# Patient Record
Sex: Female | Born: 1990 | Race: Black or African American | Hispanic: No | Marital: Single | Smoking: Never smoker
Health system: Southern US, Community
[De-identification: ages and names within clinical notes are randomized; demographics above are authoritative.]

## PROBLEM LIST (undated history)

## (undated) DIAGNOSIS — E669 Obesity, unspecified: Secondary | ICD-10-CM

## (undated) HISTORY — PX: CHOLECYSTECTOMY: SHX55

## (undated) HISTORY — DX: Obesity, unspecified: E66.9

## (undated) HISTORY — PX: DILATION AND CURETTAGE OF UTERUS: SHX78

---
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
## 2010-09-21 ENCOUNTER — Emergency Department: Payer: Self-pay | Admitting: Emergency Medicine

## 2014-02-15 ENCOUNTER — Emergency Department: Payer: Self-pay | Admitting: Emergency Medicine

## 2014-02-15 LAB — URINALYSIS, COMPLETE
Bacteria: NONE SEEN
Bilirubin,UR: NEGATIVE
Glucose,UR: NEGATIVE mg/dL (ref 0–75)
Leukocyte Esterase: NEGATIVE
Nitrite: NEGATIVE
Ph: 5 (ref 4.5–8.0)
Protein: NEGATIVE
RBC,UR: 7 /HPF (ref 0–5)
Specific Gravity: 1.028 (ref 1.003–1.030)
Squamous Epithelial: 4
WBC UR: NONE SEEN /HPF (ref 0–5)

## 2015-01-16 ENCOUNTER — Emergency Department
Admit: 2015-01-16 | Disposition: A | Discharge status: Home / Self Care | Payer: Self-pay | Admitting: Emergency Medicine

## 2015-01-20 ENCOUNTER — Emergency Department
Admit: 2015-01-20 | Disposition: A | Discharge status: Home / Self Care | Payer: Self-pay | Admitting: Emergency Medicine

## 2015-01-28 ENCOUNTER — Ambulatory Visit
Admit: 2015-01-28 | Disposition: A | Discharge status: Home / Self Care | Payer: Self-pay | Attending: Surgery | Admitting: Surgery

## 2015-01-28 LAB — HEPATIC FUNCTION PANEL A (ARMC)
Albumin: 4 g/dL
Alkaline Phosphatase: 59 U/L
Bilirubin, Direct: 0.1 mg/dL
Bilirubin,Total: 0.8 mg/dL
Indirect Bilirubin: 0.7
SGOT(AST): 12 U/L — ABNORMAL LOW
SGPT (ALT): 10 U/L — ABNORMAL LOW
Total Protein: 7.3 g/dL

## 2015-01-28 LAB — CBC WITH DIFFERENTIAL/PLATELET
Basophil #: 0 10*3/uL (ref 0.0–0.1)
Basophil %: 0.9 %
Eosinophil #: 0.1 10*3/uL (ref 0.0–0.7)
Eosinophil %: 2 %
HCT: 35.6 % (ref 35.0–47.0)
HGB: 11.9 g/dL — ABNORMAL LOW (ref 12.0–16.0)
Lymphocyte #: 1.6 10*3/uL (ref 1.0–3.6)
Lymphocyte %: 41.7 %
MCH: 30.4 pg (ref 26.0–34.0)
MCHC: 33.4 g/dL (ref 32.0–36.0)
MCV: 91 fL (ref 80–100)
Monocyte #: 0.5 x10 3/mm (ref 0.2–0.9)
Monocyte %: 13.2 %
Neutrophil #: 1.6 10*3/uL (ref 1.4–6.5)
Neutrophil %: 42.2 %
Platelet: 341 10*3/uL (ref 150–440)
RBC: 3.9 10*6/uL (ref 3.80–5.20)
RDW: 14.3 % (ref 11.5–14.5)
WBC: 3.8 10*3/uL (ref 3.6–11.0)

## 2015-01-30 ENCOUNTER — Ambulatory Visit
Admit: 2015-01-30 | Disposition: A | Discharge status: Home / Self Care | Payer: Self-pay | Attending: Surgery | Admitting: Surgery

## 2015-02-04 LAB — SURGICAL PATHOLOGY

## 2015-02-10 NOTE — Op Note (Signed)
PATIENT NAME:  Carrie Hanson, Carrie Hanson MR#:  [id number] DATE OF BIRTH:  24-Nov-1990  DATE OF PROCEDURE:  01/30/2015  PREOPERATIVE DIAGNOSIS: Biliary colic, cholelithiasis.  POSTOPERATIVE DIAGNOSIS: Biliary colic, cholelithiasis.   PROCEDURE PERFORMED: Laparoscopic cholecystectomy.   SURGEON: Drinda Belgard A. Egbert GaribaldiBird, MD   ASSISTANT: Scrub tech.   TYPE OF ANESTHESIA: General.   FINDINGS: Two large stones.   SPECIMENS: Gallbladder with contents.   ESTIMATED BLOOD LOSS: Minimal.   DESCRIPTION OF PROCEDURE: With informed consent, supine position, general endotracheal anesthesia, the patient'Hanson abdomen was widely prepped with ChloraPrep solution. Timeout was observed. Through an infraumbilical transversely oriented skin incision, a 12 mm blunt Hasson trocar was placed under direct visualization. Stay sutures being passed. Pneumoperitoneum was established to a total of 18 mmHg pressure. Laparoscopic evaluation of the abdomen demonstrated normal-appearing liver. The patient was positioned in steep reverse Trendelenburg and airplane right side up. Remaining trocars were placed with 5 mm in the epigastric, two 5 mm in the subcostal margin. The gallbladder was grasped along its fundus and elevated towards the right shoulder. Lateral traction was achieved on Hartman pouch. Chronic-appearing adhesions were taken down with sharp dissection and point cautery. A critical view of safety cholecystectomy was achieved. The cystic duct was doubly clipped on the portal side, singly clipped on the gallbladder side and divided. A single cystic artery was divided between similarly placed hemoclips. Further dissection demonstrated no evidence of an aberrant artery or bile duct.  One small lymphatic channel was divided between single hemoclips. The gallbladder was then retrieved off the gallbladder fossa utilizing hook cautery apparatus, hemostasis being adequate, placed into an Endo Catch device and retrieved. During the extraction  process, a 5 mm surgical telescope was introduced in the epigastric region demonstrating no evidence of injury to bowel from the trocar insertion at the umbilicus. With pneumoperitoneum then re-established, the right upper quadrant was irrigated with approximately 0.5 liters of warm normal saline and aspirated dry and hemostasis appeared to be excellent on the operative field.   Ports were then removed. The infraumbilical fascial defect was reapproximated with multiple simple and figure-of-eight #0 Vicryl sutures in vertical orientation and the existing stay sutures tied to each other. A total of 30 mL of 0.25% plain Marcaine was infiltrated along all skin and fascial incisions prior to closure. Vicryl 4-0 subcuticular was applied to all skin edges followed by benzoin, Steri-Strips, Telfa, and Tegaderm. The patient was then taken to the recovery room in stable and satisfactory condition by anesthesia services extubated.    ____________________________ Redge GainerMark A. Egbert GaribaldiBird, MD mab:at D: 01/30/2015 10:32:35 ET T: 01/30/2015 13:52:16 ET JOB#: [id number]  cc: Loraine LericheMark A. Egbert GaribaldiBird, MD, <Dictator> Raynald KempMARK A Sabian Kuba MD ELECTRONICALLY SIGNED 02/06/2015 12:36

## 2015-10-02 ENCOUNTER — Emergency Department: Payer: 59

## 2015-10-02 ENCOUNTER — Emergency Department
Admission: EM | Admit: 2015-10-02 | Discharge: 2015-10-02 | Disposition: A | Discharge status: Home / Self Care | Payer: 59 | Attending: Emergency Medicine | Admitting: Emergency Medicine

## 2015-10-02 DIAGNOSIS — Y9389 Activity, other specified: Secondary | ICD-10-CM | POA: Insufficient documentation

## 2015-10-02 DIAGNOSIS — M545 Low back pain, unspecified: Secondary | ICD-10-CM

## 2015-10-02 DIAGNOSIS — Z3202 Encounter for pregnancy test, result negative: Secondary | ICD-10-CM | POA: Diagnosis not present

## 2015-10-02 DIAGNOSIS — W108XXA Fall (on) (from) other stairs and steps, initial encounter: Secondary | ICD-10-CM | POA: Diagnosis not present

## 2015-10-02 DIAGNOSIS — S300XXA Contusion of lower back and pelvis, initial encounter: Secondary | ICD-10-CM | POA: Insufficient documentation

## 2015-10-02 DIAGNOSIS — Y998 Other external cause status: Secondary | ICD-10-CM | POA: Diagnosis not present

## 2015-10-02 DIAGNOSIS — Y9289 Other specified places as the place of occurrence of the external cause: Secondary | ICD-10-CM | POA: Insufficient documentation

## 2015-10-02 DIAGNOSIS — S3992XA Unspecified injury of lower back, initial encounter: Secondary | ICD-10-CM | POA: Diagnosis present

## 2015-10-02 LAB — POCT PREGNANCY, URINE: Preg Test, Ur: NEGATIVE

## 2015-10-02 MED ORDER — KETOROLAC TROMETHAMINE 60 MG/2ML IM SOLN
INTRAMUSCULAR | Status: AC
  Filled 2015-10-02: qty 2

## 2015-10-02 MED ORDER — IBUPROFEN 800 MG PO TABS: 800.0000 mg | ORAL_TABLET | Freq: Three times a day (TID) | ORAL | Status: DC | PRN

## 2015-10-02 MED ORDER — KETOROLAC TROMETHAMINE 30 MG/ML IJ SOLN: 60.0000 mg | Freq: Once | INTRAMUSCULAR | Status: AC

## 2015-10-02 MED ORDER — KETOROLAC TROMETHAMINE 60 MG/2ML IM SOLN
INTRAMUSCULAR | Status: AC
  Administered 2015-10-02: 60 mg
  Filled 2015-10-02: qty 2

## 2015-10-02 MED ORDER — OXYCODONE-ACETAMINOPHEN 5-325 MG PO TABS: 1.0000 | ORAL_TABLET | ORAL | Status: DC | PRN

## 2015-10-02 NOTE — ED Notes (Signed)
NAD noted at time of D/C. Pt denies questions or concerns. Pt ambulatory to the lobby at this time. Pt refused wheelchair to the lobby.  

## 2015-10-02 NOTE — Discharge Instructions (Signed)
1. Take pain medicines as needed (Motrin/Percocet #15). 2. Apply moist heat several times daily as needed. 3. Return to the ER for worsening symptoms, numbness/tingling, leg weakness or other concerns.  Back Pain, Adult Back pain is very common in adults.The cause of back pain is rarely dangerous and the pain often gets better over time.The cause of your back pain may not be known. Some common causes of back pain include:  Strain of the muscles or ligaments supporting the spine.  Wear and tear (degeneration) of the spinal disks.  Arthritis.  Direct injury to the back. For many people, back pain may return. Since back pain is rarely dangerous, most people can learn to manage this condition on their own. HOME CARE INSTRUCTIONS Watch your back pain for any changes. The following actions may help to lessen any discomfort you are feeling:  Remain active. It is stressful on your back to sit or stand in one place for long periods of time. Do not sit, drive, or stand in one place for more than 30 minutes at a time. Take short walks on even surfaces as soon as you are able.Try to increase the length of time you walk each day.  Exercise regularly as directed by your health care provider. Exercise helps your back heal faster. It also helps avoid future injury by keeping your muscles strong and flexible.  Do not stay in bed.Resting more than 1-2 days can delay your recovery.  Pay attention to your body when you bend and lift. The most comfortable positions are those that put less stress on your recovering back. Always use proper lifting techniques, including:  Bending your knees.  Keeping the load close to your body.  Avoiding twisting.  Find a comfortable position to sleep. Use a firm mattress and lie on your side with your knees slightly bent. If you lie on your back, put a pillow under your knees.  Avoid feeling anxious or stressed.Stress increases muscle tension and can worsen back  pain.It is important to recognize when you are anxious or stressed and learn ways to manage it, such as with exercise.  Take medicines only as directed by your health care provider. Over-the-counter medicines to reduce pain and inflammation are often the most helpful.Your health care provider may prescribe muscle relaxant drugs.These medicines help dull your pain so you can more quickly return to your normal activities and healthy exercise.  Apply ice to the injured area:  Put ice in a plastic bag.  Place a towel between your skin and the bag.  Leave the ice on for 20 minutes, 2-3 times a day for the first 2-3 days. After that, ice and heat may be alternated to reduce pain and spasms.  Maintain a healthy weight. Excess weight puts extra stress on your back and makes it difficult to maintain good posture. SEEK MEDICAL CARE IF:  You have pain that is not relieved with rest or medicine.  You have increasing pain going down into the legs or buttocks.  You have pain that does not improve in one week.  You have night pain.  You lose weight.  You have a fever or chills. SEEK IMMEDIATE MEDICAL CARE IF:   You develop new bowel or bladder control problems.  You have unusual weakness or numbness in your arms or legs.  You develop nausea or vomiting.  You develop abdominal pain.  You feel faint.   This information is not intended to replace advice given to you by your health  care provider. Make sure you discuss any questions you have with your health care provider.   Document Released: 09/28/2005 Document Revised: 10/19/2014 Document Reviewed: 01/30/2014 Elsevier Interactive Patient Education 2016 [employer]  Tailbone Injury The tailbone (coccyx) is the small bone at the lower end of the spine. A tailbone injury may involve stretched ligaments, bruising, or a broken bone (fracture). Tailbone injuries can be painful, and some may take a long time to heal. CAUSES This condition  is often caused by falling and landing on the tailbone. Other causes include:  Repeated strain or friction from actions such as rowing and bicycling.  Childbirth. In some cases, the cause may not be known. RISK FACTORS This condition is more common in women than in men. SYMPTOMS Symptoms of this condition include:  Pain in the lower back, especially when sitting.  Pain or difficulty when standing up from a sitting position.  Bruising in the tailbone area.  Painful bowel movements.  In women, pain during intercourse. DIAGNOSIS This condition may be diagnosed based on your symptoms and a physical exam. X-rays may be taken if a fracture is suspected. You may also have other tests, such as a CT scan or MRI. TREATMENT This condition may be treated with medicines to help relieve your pain. Most tailbone injuries heal on their own in 4-6 weeks. However, recovery time may be longer if the injury involves a fracture. HOME CARE INSTRUCTIONS  Take medicines only as directed by your health care provider.  If directed, apply ice to the injured area:  Put ice in a plastic bag.  Place a towel between your skin and the bag.  Leave the ice on for 20 minutes, 2-3 times per day for the first 1-2 days.  Sit on a large, rubber or inflated ring or cushion to ease your pain. Lean forward when you are sitting to help decrease discomfort.  Avoid sitting for long periods of time.  Increase your activity as the pain allows. Perform any exercises that are recommended by your health care provider or physical therapist.  If you have pain during bowel movements, use stool softeners as directed by your health care provider.  Eat a diet that includes plenty of fiber to help prevent constipation.  Keep all follow-up visits as directed by your health care provider. This is important. PREVENTION Wear appropriate padding and sports gear when bicycling and rowing. This can help to prevent developing an  injury that is caused by repeated strain or friction. SEEK MEDICAL CARE IF:  Your pain becomes worse.  Your bowel movements cause a great deal of discomfort.  You are unable to have a bowel movement.  You have uncontrolled urine loss (urinary incontinence).  You have a fever.   This information is not intended to replace advice given to you by your health care provider. Make sure you discuss any questions you have with your health care provider.   Document Released: 09/25/2000 Document Revised: 02/12/2015 Document Reviewed: 09/24/2014 Elsevier Interactive Patient Education [employer]

## 2015-10-02 NOTE — ED Provider Notes (Signed)
[facility] Emergency Department Provider Note  ____________________________________________  Time seen: Approximately 6:06 AM  I have reviewed the triage vital signs and the nursing notes.   HISTORY  Chief Complaint Fall    HPI Carrie Hanson is a 24 y.o. female presents to the ED from home with a chief complaint of tailbone pain. Patient states she slipped and fell on steps yesterday, landing onto her tailbone. Did not strike head or suffer LOC. This morning she was getting ready for work and noted pain to her tailbone and lower back. Denies associated symptoms of numbness, tingling, leg weakness, bowel or bladder incontinence. Denies recent fever, chills, chest pain, shortness of breath, abdominal pain, nausea, vomiting, diarrhea. Nothing makes her pain better. Movement makes her pain worse.   Past Medical history None   There are no active problems to display for this patient.   No past surgical history on file.  No current outpatient prescriptions on file.  Allergies Review of patient's allergies indicates no known allergies.  No family history on file.  Social History Social History  Substance Use Topics  . Smoking status: Not on file  . Smokeless tobacco: Not on file  . Alcohol Use: Not on file  No recent alcohol use.  Review of Systems Constitutional: No fever/chills Eyes: No visual changes. ENT: No sore throat. Cardiovascular: Denies chest pain. Respiratory: Denies shortness of breath. Gastrointestinal: No abdominal pain.  No nausea, no vomiting.  No diarrhea.  No constipation. Genitourinary: Negative for dysuria. Musculoskeletal: Positive for back pain and sacrum pain. Skin: Negative for rash. Neurological: Negative for headaches, focal weakness or numbness.  10-point ROS otherwise negative.  ____________________________________________   PHYSICAL EXAM:  VITAL SIGNS: ED Triage Vitals  Enc Vitals Group     BP  10/02/15 0555 122/78 mmHg     Pulse Rate 10/02/15 0555 83     Resp 10/02/15 0555 18     Temp 10/02/15 0555 98.1 F (36.7 C)     Temp Source 10/02/15 0555 Oral     SpO2 10/02/15 0555 96 %     Weight 10/02/15 0555 290 lb (131.543 kg)     Height 10/02/15 0555  (1.676 m)     Head Cir --      Peak Flow --      Pain Score 10/02/15 0555 7     Pain Loc --      Pain Edu? --      Excl. in GC? --     Constitutional: Alert and oriented. Well appearing and in no acute distress. Eyes: Conjunctivae are normal. PERRL. EOMI. Head: Atraumatic. Nose: No congestion/rhinnorhea. Mouth/Throat: Mucous membranes are moist.  Oropharynx non-erythematous. Neck: No stridor.  No cervical spine tenderness to palpation. Cardiovascular: Normal rate, regular rhythm. Grossly normal heart sounds.  Good peripheral circulation. Respiratory: Normal respiratory effort.  No retractions. Lungs CTAB. Gastrointestinal: Obese. Soft and nontender. No distention. No abdominal bruits. No CVA tenderness. Musculoskeletal: Midline lumbar spine and coccyx tenderness to palpation. No step-offs or deformities noted. Limited range of motion of back secondary to discomfort. No lower extremity tenderness nor edema.  No joint effusions. Neurologic:  Normal speech and language. No gross focal neurologic deficits are appreciated.  Skin:  Skin is warm, dry and intact. No rash noted. Psychiatric: Mood and affect are normal. Speech and behavior are normal.  ____________________________________________   LABS (all labs ordered are listed, but only abnormal results are displayed)  Labs Reviewed - No data to display  ____________________________________________  EKG  None ____________________________________________  RADIOLOGY  Pending ____________________________________________   PROCEDURES  Procedure(s) performed: None  Critical Care performed: No  ____________________________________________   INITIAL IMPRESSION /  ASSESSMENT AND PLAN / ED COURSE  Pertinent labs & imaging results that were available during my care of the patient were reviewed by me and considered in my medical decision making (see chart for details).  24 year old female who presents with lumbar spine and coccyx tenderness s/p mechanical fall one day ago. Will administer NSAIDs as patient is driving, obtain imaging studies and reassess.  ----------------------------------------- 7:08 AM on 10/02/2015 -----------------------------------------  Patient currently in x-ray. Care transferred to Dr. Inocencio HomesGayle. Anticipate discharge home on NSAIDs and analgesia. ____________________________________________   FINAL CLINICAL IMPRESSION(S) / ED DIAGNOSES  Final diagnoses:  Coccyx contusion, initial encounter  Midline low back pain without sciatica      Irean HongJade J Sung, MD 10/02/15 [phone]

## 2015-10-02 NOTE — ED Notes (Signed)
Pt in co pain to coccyx states fell yesterday landing on her buttocks.

## 2015-10-02 NOTE — ED Provider Notes (Signed)
-----------------------------------------   8:00 AM on 10/02/2015 -----------------------------------------  There was assumed from Dr. Dolores FrameSung at 7:30 AM pending x-ray results. Briefly this is a 24 year old female with tailbone pain after fall.  sacrum and lumbar spine x-rays are negative for any acute traumatic pathology. DC with return precautions and PCP follow-up.  Gayla DossEryka A Tim Wilhide, MD 10/02/15 [phone]

## 2016-03-20 ENCOUNTER — Encounter: Payer: Self-pay | Admitting: Obstetrics and Gynecology

## 2016-03-20 ENCOUNTER — Ambulatory Visit (INDEPENDENT_AMBULATORY_CARE_PROVIDER_SITE_OTHER): Payer: 59 | Admitting: Obstetrics and Gynecology

## 2016-03-20 VITALS — BP 112/70 | HR 80 | Resp 16 | Ht 65.5 in | Wt 382.0 lb

## 2016-03-20 DIAGNOSIS — N762 Acute vulvitis: Secondary | ICD-10-CM

## 2016-03-20 MED ORDER — BETAMETHASONE VALERATE 0.1 % EX OINT: TOPICAL_OINTMENT | CUTANEOUS | Status: DC

## 2016-03-20 NOTE — Progress Notes (Addendum)
Patient ID: Carrie Hanson, female   DOB: 05-Nov-1990, 25 y.o.   MRN: [id number] 25 y.o. G1P0010 SingleAfrican AmericanF here c/o vaginal irritation and itching. She c/o symptoms for over 6 months. Initially constant, now intermittent. She hasn't been sexually active x 6 months. 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 Worse in the last 6+ months. She c/o vulvar irritation, sometimes itchy, some white vaginal d/c. Occasional odor.  She recently was seen for a routine check up. Labs were done, she hasn't heard results, thinks she was tested for diabetes.  Period Duration (Days): Nexplanon- comes every 2-3 months- last  7 days  Period Pattern: (!) Irregular Menstrual Flow: Moderate, Heavy Menstrual Control: Maxi pad Dysmenorrhea: (!) Severe Dysmenorrhea Symptoms: Cramping  Patient's last menstrual period was 03/10/2016.          Sexually active: No.  The current method of family planning is Nexplanon.    Exercising: Yes.    walking Smoker:  no  Health Maintenance: Pap:  2016 with PCP- WNL per patient  History of abnormal Pap:  no TDaP:  unsure Gardasil: unsure    reports that she has never smoked. She has never used smokeless tobacco. She reports that she does not drink alcohol or use illicit drugs. Working and going to school for Engineer, sitemedical assistant.   Past Medical History  Diagnosis Date  . Obesity     Past Surgical History  Procedure Laterality Date  . Dilation and curettage of uterus      Abortion   . Cholecystectomy      Current Outpatient Prescriptions  Medication Sig Dispense Refill  . etonogestrel (NEXPLANON) 68 MG IMPL implant 1 each by Subdermal route once.     No current facility-administered medications for this visit.    Family History  Problem Relation Age of Onset  . Thyroid disease Mother   . Diabetes Mother   . Diabetes Father     Review of Systems  Constitutional: Negative.   HENT: Negative.   Eyes:  Negative.   Respiratory: Negative.   Cardiovascular: Negative.   Gastrointestinal: Negative.   Endocrine: Negative.   Genitourinary: Positive for vaginal pain.       Vaginal irritation and itching   Musculoskeletal: Negative.   Skin: Negative.   Allergic/Immunologic: Negative.   Neurological: Negative.   Psychiatric/Behavioral: Negative.     Exam:   BP 112/70 mmHg  Pulse 80  Resp 16  Ht 5' 5.5" (1.664 m)  Wt 382 lb (173.274 kg)  BMI 62.58 kg/m2  LMP 03/10/2016  Weight change: @WEIGHTCHANGE @ Height:   Height: 5' 5.5" (166.4 cm)  Ht Readings from Last 3 Encounters:  03/20/16 5' 5.5" (1.664 m)  10/02/15 5\' 6"  (1.676 m)    General appearance: alert, cooperative and appears stated age   Pelvic: External genitalia:  no lesions              Urethra:  normal appearing urethra with no masses, tenderness or lesions              Bartholins and Skenes: normal                 Vagina: normal appearing vagina with a slight increase in clumpy, white vaginal discharge              Cervix: no lesions  Chaperone was present for exam.  Wet prep: no clue, no trich, + wbc KOH: no yeast PH: 4  A:  Vulvitis, suspect yeast, but negative vaginal slides  Obesity, FH of diabetes, she has an increased risk of diabetes  P:   Wet prep probe sent  Will treat with steroid ointment for the next 1-2 weeks  Get a copy of her recent labs, including STD testing and testing for DM  Discussed vulvar skin care   Addendum: Labs from 5/17 reviewed. Normal CMP, normal lipids, normal HgbA1C (5.4%), negative genprobe 11/11/15: normal pap

## 2016-03-21 LAB — WET PREP BY MOLECULAR PROBE
Candida species: NEGATIVE
Gardnerella vaginalis: POSITIVE — AB
Trichomonas vaginosis: NEGATIVE

## 2016-03-23 ENCOUNTER — Other Ambulatory Visit: Payer: Self-pay | Admitting: *Deleted

## 2016-03-23 ENCOUNTER — Telehealth: Payer: Self-pay | Admitting: Obstetrics and Gynecology

## 2016-03-23 MED ORDER — METRONIDAZOLE 500 MG PO TABS: 500.0000 mg | ORAL_TABLET | Freq: Two times a day (BID) | ORAL | Status: DC

## 2016-03-23 NOTE — Telephone Encounter (Signed)
Patient is returning a call to Elaine. °

## 2016-03-23 NOTE — Telephone Encounter (Signed)
RX sent in for Flagyl per Dr. Oscar LaJertson

## 2016-03-27 ENCOUNTER — Telehealth: Payer: Self-pay | Admitting: Obstetrics and Gynecology

## 2016-03-27 MED ORDER — FLUCONAZOLE 150 MG PO TABS: 150.0000 mg | ORAL_TABLET | Freq: Once | ORAL | Status: DC

## 2016-03-27 NOTE — Telephone Encounter (Signed)
Spoke with patient. Advised of message as seen below from Dr.Silva. She is agreeable and verbalizes understanding. Rx for Diflucan 150 mg po x 1. Repeat in 72 hours #2 0RF sent to pharmacy on file.  Routing to provider for final review. Patient agreeable to disposition. Will close encounter.

## 2016-03-27 NOTE — Telephone Encounter (Signed)
May have yeast infection.  Ok for Dilfucan 150 mg po x 1.  May repeat in 72 hours.  Dispense - 2.  RF none.

## 2016-03-27 NOTE — Telephone Encounter (Signed)
Patient says she think she may be getting irritated in her vaginal area due to antibiotics.

## 2016-03-27 NOTE — Telephone Encounter (Signed)
Spoke with patient. Patient states that she started Flagyl on 03/23/2016. Reports she had slight relief of symptoms the first couple of days on the medication, but has now developed increased vaginal irritation and discharge. Reports discharge is clear/white. Denies any vaginal itching. Reports burning with urination and increased urinary frequency. Reports she feels urinary frequency is from increasing her fluid intake. Denies any lower back pain, fever, or chills. Patient is asking what she may do for relief. Advised I will speak with Dr.Silva who is covering for Dr.Jertson and return call with further recommendations. She is agreeable.

## 2016-04-21 ENCOUNTER — Encounter: Payer: Self-pay | Admitting: *Deleted

## 2016-06-30 ENCOUNTER — Ambulatory Visit (INDEPENDENT_AMBULATORY_CARE_PROVIDER_SITE_OTHER): Payer: 59 | Admitting: Obstetrics and Gynecology

## 2016-06-30 ENCOUNTER — Encounter: Payer: Self-pay | Admitting: Obstetrics and Gynecology

## 2016-06-30 VITALS — BP 100/60 | HR 80 | Resp 16 | Wt 367.0 lb

## 2016-06-30 DIAGNOSIS — R300 Dysuria: Secondary | ICD-10-CM

## 2016-06-30 DIAGNOSIS — N898 Other specified noninflammatory disorders of vagina: Secondary | ICD-10-CM | POA: Diagnosis not present

## 2016-06-30 LAB — POCT URINALYSIS DIPSTICK
Bilirubin, UA: NEGATIVE
Blood, UA: NEGATIVE
Glucose, UA: NEGATIVE
Ketones, UA: NEGATIVE
Leukocytes, UA: NEGATIVE
Nitrite, UA: NEGATIVE
Protein, UA: NEGATIVE
Urobilinogen, UA: NEGATIVE
pH, UA: 6.5

## 2016-06-30 MED ORDER — SULFAMETHOXAZOLE-TRIMETHOPRIM 800-160 MG PO TABS: 1.0000 | ORAL_TABLET | Freq: Two times a day (BID) | ORAL | 0 refills | Status: DC

## 2016-06-30 MED ORDER — PHENAZOPYRIDINE HCL 200 MG PO TABS: ORAL_TABLET | ORAL | 0 refills | Status: DC

## 2016-06-30 NOTE — Patient Instructions (Signed)

## 2016-06-30 NOTE — Progress Notes (Signed)
GYNECOLOGY  VISIT   HPI: 25 y.o.   Single  African American  female   G1P0010 with Patient's last menstrual period was 06/08/2016.   Here c/o vaginal itching and irritation  She c/o pain with urination.  Last week she treated herself for a possible yeast infection, then she felt very irritated on the outside. She thinks the pain with urination is internal. She is having frequency and urgency to void. Initially last week she was voiding small amounts, now normal amounts. The dysuria is mild. Slight cramping in the suprapubic region.  Some vaginal d/c, white, not sure if it is from the cream she used last week. Slight vulvar irritation, no itching, no odor.   GYNECOLOGIC HISTORY: Patient's last menstrual period was 06/08/2016. Contraception:none Menopausal hormone therapy: none         OB History    Gravida Para Term Preterm AB Living   1       1     SAB TAB Ectopic Multiple Live Births                     There are no active problems to display for this patient.   Past Medical History:  Diagnosis Date  . Obesity     Past Surgical History:  Procedure Laterality Date  . CHOLECYSTECTOMY    . DILATION AND CURETTAGE OF UTERUS     Abortion     Current Outpatient Prescriptions  Medication Sig Dispense Refill  . betamethasone valerate ointment (VALISONE) 0.1 % Apply a pea sized amount topically BID x 1-2 weeks 15 g 0   No current facility-administered medications for this visit.      ALLERGIES: Review of patient's allergies indicates no known allergies.  Family History  Problem Relation Age of Onset  . Thyroid disease Mother   . Diabetes Mother   . Diabetes Father     Social History   Social History  . Marital status: Single    Spouse name: N/A  . Number of children: N/A  . Years of education: N/A   Occupational History  . Not on file.   Social History Main Topics  . Smoking status: Never Smoker  . Smokeless tobacco: Never Used  . Alcohol use No  . Drug  use: No  . Sexual activity: No   Other Topics Concern  . Not on file   Social History Narrative  . No narrative on file    Review of Systems  Constitutional: Negative.   HENT: Negative.   Eyes: Negative.   Respiratory: Negative.   Cardiovascular: Negative.   Gastrointestinal: Negative.   Genitourinary: Positive for dysuria.       Vaginal itching and irritation   Musculoskeletal: Negative.   Skin: Negative.   Neurological: Negative.   Endo/Heme/Allergies: Negative.   Psychiatric/Behavioral: Negative.     PHYSICAL EXAMINATION:    BP 100/60 (BP Location: Right Wrist, Patient Position: Sitting, Cuff Size: Normal)   Pulse 80   Resp 16   Wt (!) 367 lb (166.5 kg)   LMP 06/08/2016   BMI 60.14 kg/m     General appearance: alert, cooperative and appears stated age Abdomen: soft, mildly tender in the suprapubic region, not distended, no masses,  no organomegaly  Pelvic: External genitalia:  no lesions              Urethra:  normal appearing urethra with no masses, tenderness or lesions  Bartholins and Skenes: normal                 Vagina: normal appearing vagina with some increase in white creamy vaginal d/c              Cervix: no lesions               Chaperone was present for exam.  Wet prep: no clue, no trich, few wbc, some artifact KOH: no yeast PH: 4   ASSESSMENT C/O dysuria, some frequency and urgency, SP discomfort, negative urine dip, symptoms c/w UTI Mild vulvar irritation, negative wet prep/KOH    PLAN Send urine for ua, c&s Send wet prep probe She has valisone ointment at home, she will use this 2 x a day for up to a week, has already tried Vaseline Bactrim for possible UTI Pyridium    An After Visit Summary was printed and given to the patient.  25 minutes face to face time of which over 50% was spent in counseling.

## 2016-07-01 LAB — WET PREP BY MOLECULAR PROBE
Candida species: NEGATIVE
Gardnerella vaginalis: NEGATIVE
Trichomonas vaginosis: NEGATIVE

## 2016-07-01 LAB — URINE CULTURE

## 2016-07-01 LAB — URINALYSIS, MICROSCOPIC ONLY
Bacteria, UA: NONE SEEN [HPF]
Casts: NONE SEEN [LPF]
Crystals: NONE SEEN [HPF]
RBC / HPF: NONE SEEN RBC/HPF (ref <=2)
WBC, UA: NONE SEEN WBC/HPF (ref <=5)
Yeast: NONE SEEN [HPF]

## 2016-07-03 ENCOUNTER — Ambulatory Visit: Payer: 59 | Admitting: Obstetrics and Gynecology

## 2017-01-07 ENCOUNTER — Ambulatory Visit (INDEPENDENT_AMBULATORY_CARE_PROVIDER_SITE_OTHER): Payer: 59 | Admitting: Obstetrics and Gynecology

## 2017-01-07 ENCOUNTER — Encounter: Payer: Self-pay | Admitting: Obstetrics and Gynecology

## 2017-01-07 ENCOUNTER — Telehealth: Payer: Self-pay | Admitting: Obstetrics and Gynecology

## 2017-01-07 VITALS — BP 100/70 | HR 80 | Resp 18 | Ht 65.0 in | Wt 374.0 lb

## 2017-01-07 DIAGNOSIS — Z01419 Encounter for gynecological examination (general) (routine) without abnormal findings: Secondary | ICD-10-CM

## 2017-01-07 DIAGNOSIS — Z0000 Encounter for general adult medical examination without abnormal findings: Secondary | ICD-10-CM | POA: Diagnosis not present

## 2017-01-07 DIAGNOSIS — N760 Acute vaginitis: Secondary | ICD-10-CM | POA: Diagnosis not present

## 2017-01-07 DIAGNOSIS — N750 Cyst of Bartholin's gland: Secondary | ICD-10-CM

## 2017-01-07 DIAGNOSIS — Z833 Family history of diabetes mellitus: Secondary | ICD-10-CM

## 2017-01-07 DIAGNOSIS — Z113 Encounter for screening for infections with a predominantly sexual mode of transmission: Secondary | ICD-10-CM | POA: Diagnosis not present

## 2017-01-07 DIAGNOSIS — Z7251 High risk heterosexual behavior: Secondary | ICD-10-CM | POA: Diagnosis not present

## 2017-01-07 LAB — CBC
HCT: 38.8 % (ref 35.0–45.0)
Hemoglobin: 12 g/dL (ref 11.7–15.5)
MCH: 29.1 pg (ref 27.0–33.0)
MCHC: 30.9 g/dL — ABNORMAL LOW (ref 32.0–36.0)
MCV: 93.9 fL (ref 80.0–100.0)
MPV: 9.1 fL (ref 7.5–12.5)
Platelets: 422 10*3/uL — ABNORMAL HIGH (ref 140–400)
RBC: 4.13 MIL/uL (ref 3.80–5.10)
RDW: 13.9 % (ref 11.0–15.0)
WBC: 3.4 10*3/uL — ABNORMAL LOW (ref 3.8–10.8)

## 2017-01-07 LAB — COMPREHENSIVE METABOLIC PANEL
ALT: 17 U/L (ref 6–29)
AST: 19 U/L (ref 10–30)
Albumin: 3.9 g/dL (ref 3.6–5.1)
Alkaline Phosphatase: 82 U/L (ref 33–115)
BUN: 13 mg/dL (ref 7–25)
CO2: 17 mmol/L — ABNORMAL LOW (ref 20–31)
Calcium: 9.2 mg/dL (ref 8.6–10.2)
Chloride: 108 mmol/L (ref 98–110)
Creat: 0.8 mg/dL (ref 0.50–1.10)
Glucose, Bld: 94 mg/dL (ref 65–99)
Potassium: 4.5 mmol/L (ref 3.5–5.3)
Sodium: 141 mmol/L (ref 135–146)
Total Bilirubin: 0.6 mg/dL (ref 0.2–1.2)
Total Protein: 6.9 g/dL (ref 6.1–8.1)

## 2017-01-07 LAB — TSH: TSH: 1.14 mIU/L

## 2017-01-07 LAB — POCT URINE PREGNANCY: Preg Test, Ur: NEGATIVE

## 2017-01-07 MED ORDER — ULIPRISTAL ACETATE 30 MG PO TABS: 1.0000 | ORAL_TABLET | Freq: Once | ORAL | 0 refills | Status: AC

## 2017-01-07 MED ORDER — ULIPRISTAL ACETATE 30 MG PO TABS: 30.0000 mg | ORAL_TABLET | Freq: Once | ORAL | Status: DC

## 2017-01-07 NOTE — Patient Instructions (Addendum)
Check if you have had the HPV vaccinations (3 of them), also called gardasil  EXERCISE AND DIET:  We recommended that you start or continue a regular exercise program for good health. Regular exercise means any activity that makes your heart beat faster and makes you sweat.  We recommend exercising at least 30 minutes per day at least 3 days a week, preferably 4 or 5.  We also recommend a diet low in fat and sugar.  Inactivity, poor dietary choices and obesity can cause diabetes, heart attack, stroke, and kidney damage, among others.    ALCOHOL AND SMOKING:  Women should limit their alcohol intake to no more than 7 drinks/beers/glasses of wine (combined, not each!) per week. Moderation of alcohol intake to this level decreases your risk of breast cancer and liver damage. And of course, no recreational drugs are part of a healthy lifestyle.  And absolutely no smoking or even second hand smoke. Most people know smoking can cause heart and lung diseases, but did you know it also contributes to weakening of your bones? Aging of your skin?  Yellowing of your teeth and nails?  CALCIUM AND VITAMIN D:  Adequate intake of calcium and Vitamin D are recommended.  The recommendations for exact amounts of these supplements seem to change often, but generally speaking 600 mg of calcium (either carbonate or citrate) and 800 units of Vitamin D per day seems prudent. Certain women may benefit from higher intake of Vitamin D.  If you are among these women, your doctor will have told you during your visit.    PAP SMEARS:  Pap smears, to check for cervical cancer or precancers,  have traditionally been done yearly, although recent scientific advances have shown that most women can have pap smears less often.  However, every woman still should have a physical exam from her gynecologist every year. It will include a breast check, inspection of the vulva and vagina to check for abnormal growths or skin changes, a visual exam of  the cervix, and then an exam to evaluate the size and shape of the uterus and ovaries.  And after 26 years of age, a rectal exam is indicated to check for rectal cancers. We will also provide age appropriate advice regarding health maintenance, like when you should have certain vaccines, screening for sexually transmitted diseases, bone density testing, colonoscopy, mammograms, etc.   MAMMOGRAMS:  All women over 26 years old should have a yearly mammogram. Many facilities now offer a "3D" mammogram, which may cost around $50 extra out of pocket. If possible,  we recommend you accept the option to have the 3D mammogram performed.  It both reduces the number of women who will be called back for extra views which then turn out to be normal, and it is better than the routine mammogram at detecting truly abnormal areas.    COLONOSCOPY:  Colonoscopy to screen for colon cancer is recommended for all women at age 26.  We know, you hate the idea of the prep.  We agree, BUT, having colon cancer and not knowing it is worse!!  Colon cancer so often starts as a polyp that can be seen and removed at colonscopy, which can quite literally save your life!  And if your first colonoscopy is normal and you have no family history of colon cancer, most women don't have to have it again for 10 years.  Once every ten years, you can do something that may end up saving your life, right?  We will be happy to help you get it scheduled when you are ready.  Be sure to check your insurance coverage so you understand how much it will cost.  It may be covered as a preventative service at no cost, but you should check your particular policy.      Bartholin Cyst or Abscess A Bartholin cyst is a fluid-filled sac that forms on a Bartholin gland. Bartholin glands are small glands that are located within the folds of skin (labia) along the sides of the lower opening of the vagina. These glands produce a fluid to moisten the outside of the vagina  during sexual intercourse. A Bartholin cyst causes a bulge on the side of the vagina. A cyst that is not large or infected may not cause symptoms or problems. However, if the fluid within the cyst becomes infected, the cyst can turn into an abscess. An abscess may cause discomfort or pain. What are the causes? A Bartholin cyst may develop when the duct of the gland becomes blocked. In many cases, the cause of this is not known. Various kinds of bacteria can cause the cyst to become infected and develop into an abscess. What increases the risk? You may be at an increased risk of developing a Bartholin cyst or abscess if:  You are a woman of reproductive age.  You have a history of previous Bartholin cysts or abscesses.  You have diabetes.  You have a sexually transmitted disease (STD). What are the signs or symptoms? The severity of symptoms varies depending on the size of the cyst and whether it is infected. Symptoms may include:  A bulge or swelling near the lower opening of your vagina.  Discomfort or pain.  Redness.  Pain during sexual intercourse.  Pain when walking.  Fluid draining from the area. How is this diagnosed? Your health care provider may make a diagnosis based on your symptoms and a physical exam. He or she will look for swelling in your vaginal area. Blood tests may be done to check for infections. A sample of fluid from the cyst or abscess may also be taken to be tested in a lab. How is this treated? Small cysts that are not infected may not require any treatment. These often go away on their own. Yourhealth care provider will recommend hot baths and the use of warm compresses. These may also be part of the treatment for an abscess. Treatment options for a large cyst or abscess may include:  Antibiotic medicine.  A surgical procedure to drain the abscess. One of the following procedures may be done:  Incision and drainage. An incision is made in the cyst or  abscess so that the fluid drains out. A catheter may be placed inside the cyst so that it does not close and fill up with fluid again. The catheter will be removed after you have a follow-up visit with a specialist (gynecologist).  Marsupialization. The cyst or abscess is opened and kept open by stitching the edges of the skin to the walls of the cyst or abscess. This allows it to continue to drain and not fill up with fluid again. If you have cysts or abscesses that keep returning and have required incision and drainage multiple times, your health care provider may talk to you about surgery to remove the Bartholin gland. Follow these instructions at home:  Take medicines only as directed by your health care provider.  If you were prescribed an antibiotic medicine, finish it all  even if you start to feel better.  Apply warm, wet compresses to the area or take warm, shallow baths that cover your pelvic region (sitz baths) several times a day or as directed by your health care provider.  Do not squeeze the cyst or apply heavy pressure to it.  Do not have sexual intercourse until the cyst has gone away.  If your cyst or abscess was opened, a small piece of gauze or a drain may have been placed in the area to allow drainage. Do not remove the gauze or the drain until directed by your health care provider.  Wear feminine pads-not tampons-as needed for any drainage or bleeding.  Keep all follow-up visits as directed by your health care provider. This is important. How is this prevented? Take these steps to help prevent a Bartholin cyst from returning:  Practice good hygiene.  Clean your vaginal area with mild soap and a soft cloth when you bathe.  Practice safe sex to prevent STDs. Contact a health care provider if:  You have increased pain, swelling, or redness in the area of the cyst.  Puslike drainage is coming from the cyst.  You have a fever. This information is not intended to  replace advice given to you by your health care provider. Make sure you discuss any questions you have with your health care provider. Document Released: 09/28/2005 Document Revised: 03/05/2016 Document Reviewed: 05/14/2014 Elsevier Interactive Patient Education  2017 [employer]  Breast Self-Awareness Breast self-awareness means being familiar with how your breasts look and feel. It involves checking your breasts regularly and reporting any changes to your health care provider. Practicing breast self-awareness is important. A change in your breasts can be a sign of a serious medical problem. Being familiar with how your breasts look and feel allows you to find any problems early, when treatment is more likely to be successful. All women should practice breast self-awareness, including women who have had breast implants. How to do a breast self-exam One way to learn what is normal for your breasts and whether your breasts are changing is to do a breast self-exam. To do a breast self-exam: Look for Changes   1. Remove all the clothing above your waist. 2. Stand in front of a mirror in a room with good lighting. 3. Put your hands on your hips. 4. Push your hands firmly downward. 5. Compare your breasts in the mirror. Look for differences between them (asymmetry), such as:  Differences in shape.  Differences in size.  Puckers, dips, and bumps in one breast and not the other. 6. Look at each breast for changes in your skin, such as:  Redness.  Scaly areas. 7. Look for changes in your nipples, such as:  Discharge.  Bleeding.  Dimpling.  Redness.  A change in position. Feel for Changes   Carefully feel your breasts for lumps and changes. It is best to do this while lying on your back on the floor and again while sitting or standing in the shower or tub with soapy water on your skin. Feel each breast in the following way:  Place the arm on the side of the breast you are  examining above your head.  Feel your breast with the other hand.  Start in the nipple area and make  inch (2 cm) overlapping circles to feel your breast. Use the pads of your three middle fingers to do this. Apply light pressure, then medium pressure, then firm pressure. The light pressure  will allow you to feel the tissue closest to the skin. The medium pressure will allow you to feel the tissue that is a little deeper. The firm pressure will allow you to feel the tissue close to the ribs.  Continue the overlapping circles, moving downward over the breast until you feel your ribs below your breast.  Move one finger-width toward the center of the body. Continue to use the  inch (2 cm) overlapping circles to feel your breast as you move slowly up toward your collarbone.  Continue the up and down exam using all three pressures until you reach your armpit. Write Down What You Find   Write down what is normal for each breast and any changes that you find. Keep a written record with breast changes or normal findings for each breast. By writing this information down, you do not need to depend only on memory for size, tenderness, or location. Write down where you are in your menstrual cycle, if you are still menstruating. If you are having trouble noticing differences in your breasts, do not get discouraged. With time you will become more familiar with the variations in your breasts and more comfortable with the exam. How often should I examine my breasts? Examine your breasts every month. If you are breastfeeding, the best time to examine your breasts is after a feeding or after using a breast pump. If you menstruate, the best time to examine your breasts is 5-7 days after your period is over. During your period, your breasts are lumpier, and it may be more difficult to notice changes. When should I see my health care provider? See your health care provider if you notice:  A change in shape or size  of your breasts or nipples.  A change in the skin of your breast or nipples, such as a reddened or scaly area.  Unusual discharge from your nipples.  A lump or thick area that was not there before.  Pain in your breasts.  Anything that concerns you. This information is not intended to replace advice given to you by your health care provider. Make sure you discuss any questions you have with your health care provider. Document Released: 09/28/2005 Document Revised: 03/05/2016 Document Reviewed: 08/18/2015 Elsevier Interactive Patient Education  2017 [employer].

## 2017-01-07 NOTE — Addendum Note (Signed)
Addended by: Lorri FrederickHANNER, Anetta Olvera E on: 01/07/2017 09:14 AM   Modules accepted: Orders

## 2017-01-07 NOTE — Telephone Encounter (Signed)
Patient was seen this morning and went to her pharmacy to pick up her prescription. Patient said the pharmacy told her they had not received a refill request.

## 2017-01-07 NOTE — Telephone Encounter (Signed)
RX for Carrie Hanson previously entered as clinic administered medication, this order was discontinued and new order placed.    Call to patient, left message, ok per current dpr on file. Advised patient requested medication has been sent to pharmacy on file, follow-up with pharmacy for filling. Return call to office at [phone] for any additional questions.   Routing to provider for final review. Patient is agreeable to disposition. Will close encounter.

## 2017-01-07 NOTE — Progress Notes (Signed)
26 y.o. G1P0010 SingleAfrican AmericanF here for annual exam.  She had a nexplanon removed last summer, didn't like it. No current sexual partner, but she has been sexually active recently. Didn't use condoms. Didn't use plan B, doesn't want to get pregnant.  For the last couple of months she c/o vulvar irritation, itching at the end of her cycle. Bad, hard to urinate. Not aware of any lesions, whole area hurts. Symptoms seem better after using yeast treatment. She feels slight irritation now.  Period Cycle (Days): 28 Period Duration (Days): 5 days  Period Pattern: Regular Menstrual Flow: Heavy, Moderate Menstrual Control: Maxi pad Menstrual Control Change Freq (Hours): changes pad every 2-3 hours  Dysmenorrhea: (!) Moderate Dysmenorrhea Symptoms: Cramping  Patient's last menstrual period was 12/19/2016.          Sexually active: Yes.    The current method of family planning is none.    Exercising: Yes.    gym Smoker:  no  Health Maintenance: Pap:  11-07-15 WNL  History of abnormal Pap:  no TDaP: 11/2016   Gardasil: unsure, she will check her immunization record.   reports that she has never smoked. She has never used smokeless tobacco. She reports that she does not drink alcohol or use drugs. About to graduate from college, Engineer, sitemedical assistant.  Also working.   Past Medical History:  Diagnosis Date  . Obesity     Past Surgical History:  Procedure Laterality Date  . CHOLECYSTECTOMY    . DILATION AND CURETTAGE OF UTERUS     Abortion     No current outpatient prescriptions on file.   No current facility-administered medications for this visit.     Family History  Problem Relation Age of Onset  . Thyroid disease Mother   . Diabetes Mother   . Diabetes Father     Review of Systems  Constitutional: Negative.   HENT: Negative.   Eyes: Negative.   Respiratory: Negative.   Cardiovascular: Negative.   Gastrointestinal: Negative.   Endocrine: Negative.   Genitourinary:        Vaginal irritation   Musculoskeletal: Negative.   Skin: Negative.   Allergic/Immunologic: Negative.   Neurological: Negative.   Psychiatric/Behavioral: Negative.     Exam:   BP 100/70 (BP Location: Right Wrist, Patient Position: Sitting, Cuff Size: Normal)   Pulse 80   Resp 18   Ht 5\' 5"  (1.651 m)   Wt (!) 374 lb (169.6 kg)   LMP 12/19/2016   BMI 62.24 kg/m   Weight change: @WEIGHTCHANGE @ Height:   Height: 5\' 5"  (165.1 cm)  Ht Readings from Last 3 Encounters:  01/07/17 5\' 5"  (1.651 m)  03/20/16 5' 5.5" (1.664 m)  10/02/15 5\' 6"  (1.676 m)    General appearance: alert, cooperative and appears stated age Head: Normocephalic, without obvious abnormality, atraumatic Neck: no adenopathy, supple, symmetrical, trachea midline and thyroid normal to inspection and palpation Lungs: clear to auscultation bilaterally Cardiovascular: regular rate and rhythm Breasts: normal appearance, no masses or tenderness Abdomen: soft, non-tender; bowel sounds normal; no masses,  no organomegaly Extremities: extremities normal, atraumatic, no cyanosis or edema Skin: Skin color, texture, turgor normal. No rashes or lesions Lymph nodes: Cervical, supraclavicular, and axillary nodes normal. No abnormal inguinal nodes palpated Neurologic: Grossly normal   Pelvic: External genitalia:  no lesions              Urethra:  normal appearing urethra with no masses, tenderness or lesions  Bartholins and Skenes: 1.5-2 cm left bartholin's cyst, ? Tender, tender everywhere.               Vagina: normal appearing vagina with a slight increase in white vaginal d/c, her entire vagina seems tender              Cervix: no lesions               Bimanual Exam:  Uterus:  Exam limited, no masses or tenderness              Adnexa: no mass, fullness, tenderness               Rectovaginal: Confirms               Anus:  normal sphincter tone, no lesions  Chaperone was present for exam.  A:  Well Woman with  normal exam  Family history diabetes  Overweight, just joined a gym, discussed weight watchers  Recurrent vulvar irritation, cyclic, vagina very tender on exam  Left bartholin's cyst   P:   No pap this year  UPT  Screening for STD, including hsv  Screening labs  Vaginitis probe  If she has a vaginitis will treat it and then have her come back to reevaluate the bartholins cyst for tenderness. We discussed if it's asymptomatic, no treatment needed.   Declines contraception  Samson Frederic called in for emergency contraception, had unprotected intercourse 5 days ago  Use condoms if sexually active  She will check if she has had the hpv vaccination

## 2017-01-08 ENCOUNTER — Other Ambulatory Visit: Payer: Self-pay | Admitting: Obstetrics and Gynecology

## 2017-01-08 LAB — HEPATITIS C ANTIBODY: HCV Ab: NEGATIVE

## 2017-01-08 LAB — STD PANEL
HIV 1&2 Ab, 4th Generation: NONREACTIVE
Hepatitis B Surface Ag: NEGATIVE

## 2017-01-08 LAB — WET PREP BY MOLECULAR PROBE
Candida species: NOT DETECTED
Gardnerella vaginalis: DETECTED — AB
Trichomonas vaginosis: NOT DETECTED

## 2017-01-08 LAB — GC/CHLAMYDIA PROBE AMP
CT Probe RNA: NOT DETECTED
GC Probe RNA: NOT DETECTED

## 2017-01-08 LAB — HEMOGLOBIN A1C
Hgb A1c MFr Bld: 4.9 % (ref <5.7)
Mean Plasma Glucose: 94 mg/dL

## 2017-01-08 MED ORDER — METRONIDAZOLE 500 MG PO TABS: 500.0000 mg | ORAL_TABLET | Freq: Two times a day (BID) | ORAL | 0 refills | Status: DC

## 2017-01-11 ENCOUNTER — Telehealth: Payer: Self-pay | Admitting: Obstetrics and Gynecology

## 2017-01-11 NOTE — Telephone Encounter (Signed)
error 

## 2017-01-11 NOTE — Telephone Encounter (Deleted)
Patient states she is returning your call

## 2017-01-19 ENCOUNTER — Telehealth: Payer: Self-pay | Admitting: Obstetrics and Gynecology

## 2017-01-19 NOTE — Telephone Encounter (Signed)
Patient cancelled her recheck appointment on Thursday and will call back to reschedule once she get her work schedule.

## 2017-01-21 ENCOUNTER — Ambulatory Visit: Payer: 59 | Admitting: Obstetrics and Gynecology

## 2017-02-22 ENCOUNTER — Telehealth: Payer: Self-pay | Admitting: Obstetrics and Gynecology

## 2017-02-22 NOTE — Telephone Encounter (Signed)
Left message to call Carrie Hanson at [phone].  

## 2017-02-22 NOTE — Telephone Encounter (Signed)
Patient called and requested to speak with the nurse about some symptoms she is having vaginally.

## 2017-02-23 ENCOUNTER — Ambulatory Visit: Payer: 59 | Admitting: Obstetrics and Gynecology

## 2017-02-24 NOTE — Telephone Encounter (Signed)
Left message to call Jontae Sonier at [phone].  

## 2017-02-26 NOTE — Telephone Encounter (Signed)
Dr. Oscar LaJertson, ok to close encounter? Left message x2, no return call.

## 2017-02-28 NOTE — Telephone Encounter (Signed)
Will close encounter

## 2017-03-18 ENCOUNTER — Telehealth: Payer: Self-pay | Admitting: Obstetrics and Gynecology

## 2017-03-18 ENCOUNTER — Ambulatory Visit: Payer: 59 | Admitting: Obstetrics and Gynecology

## 2017-03-18 ENCOUNTER — Encounter: Payer: Self-pay | Admitting: Obstetrics and Gynecology

## 2017-03-18 NOTE — Telephone Encounter (Signed)
Patient dnka her 2 week recheck appointment this morning. I left a message for her to call to reschedule. [facility] policy followed.

## 2017-04-28 NOTE — Telephone Encounter (Signed)
Patient has not returned calls to reschedule this appointment. Okay to close encounter?

## 2017-05-18 NOTE — Telephone Encounter (Signed)
Okay to close encounter?  °

## 2017-07-03 ENCOUNTER — Encounter (HOSPITAL_COMMUNITY): Payer: Self-pay | Admitting: Emergency Medicine

## 2017-07-03 ENCOUNTER — Emergency Department (HOSPITAL_COMMUNITY)
Admission: EM | Admit: 2017-07-03 | Discharge: 2017-07-03 | Disposition: A | Discharge status: Home / Self Care | Payer: 59 | Attending: Emergency Medicine | Admitting: Emergency Medicine

## 2017-07-03 DIAGNOSIS — J020 Streptococcal pharyngitis: Secondary | ICD-10-CM | POA: Insufficient documentation

## 2017-07-03 DIAGNOSIS — Z79899 Other long term (current) drug therapy: Secondary | ICD-10-CM | POA: Insufficient documentation

## 2017-07-03 LAB — RAPID STREP SCREEN (MED CTR MEBANE ONLY): Streptococcus, Group A Screen (Direct): POSITIVE — AB

## 2017-07-03 MED ORDER — DEXAMETHASONE SODIUM PHOSPHATE 10 MG/ML IJ SOLN
10.0000 mg | Freq: Once | INTRAMUSCULAR | Status: AC
  Administered 2017-07-03: 10 mg via INTRAMUSCULAR
  Filled 2017-07-03: qty 1

## 2017-07-03 MED ORDER — PENICILLIN G BENZATHINE 1200000 UNIT/2ML IM SUSP
1.2000 10*6.[IU] | Freq: Once | INTRAMUSCULAR | Status: AC
  Administered 2017-07-03: 1.2 10*6.[IU] via INTRAMUSCULAR
  Filled 2017-07-03: qty 2

## 2017-07-03 MED ORDER — LIDOCAINE VISCOUS 2 % MT SOLN: 15.0000 mL | OROMUCOSAL | 0 refills | Status: DC | PRN

## 2017-07-03 NOTE — ED Provider Notes (Signed)
[facility]-EMERGENCY DEPT Provider Note   CSN: [id number] Arrival date & time: 07/03/17  1555     History   Chief Complaint Chief Complaint  Patient presents with  . Sore Throat    HPI Carrie Hanson is a 26 y.o. female who presents to the emergency department with a chief complaint of constant left-sided sore throat that began 4 days ago, but has significantly worsened since yesterday. She reports associated pain with swallowing. No fever, chills, trismus, drooling, muffled voice, or shortness of breath. She has treated her symptoms with throat lozenges without improvement. Sick contacts include a coworker who has recently been treated for pneumonia.  The history is provided by the patient. No language interpreter was used.    Past Medical History:  Diagnosis Date  . Obesity     There are no active problems to display for this patient.   Past Surgical History:  Procedure Laterality Date  . CHOLECYSTECTOMY    . DILATION AND CURETTAGE OF UTERUS     Abortion     OB History    Gravida Para Term Preterm AB Living   1       1     SAB TAB Ectopic Multiple Live Births                   Home Medications    Prior to Admission medications   Medication Sig Start Date End Date Taking? Authorizing Provider  lidocaine (XYLOCAINE) 2 % solution Use as directed 15 mLs in the mouth or throat every 3 (three) hours as needed for mouth pain. 07/03/17   Yulissa Needham A, PA-C  metroNIDAZOLE (FLAGYL) 500 MG tablet Take 1 tablet (500 mg total) by mouth 2 (two) times daily. 01/08/17   Romualdo Bolk, MD    Family History Family History  Problem Relation Age of Onset  . Thyroid disease Mother   . Diabetes Mother   . Diabetes Father     Social History Social History  Substance Use Topics  . Smoking status: Never Smoker  . Smokeless tobacco: Never Used  . Alcohol use No     Allergies   Patient has no known allergies.   Review of Systems Review of Systems    Constitutional: Negative for activity change, chills and fever.  HENT: Positive for sore throat and trouble swallowing. Negative for mouth sores and voice change.   Respiratory: Negative for shortness of breath.   Cardiovascular: Negative for chest pain.  Gastrointestinal: Negative for abdominal pain.  Musculoskeletal: Negative for back pain.  Skin: Negative for rash.     Physical Exam Updated Vital Signs BP (!) 144/93 (BP Location: Left Wrist)   Pulse (!) 101   Temp 98 F (36.7 C) (Oral)   Resp 18   Ht  (1.651 m)   Wt (!) 149.7 kg (330 lb)   LMP 06/12/2017   SpO2 98%   BMI 54.91 kg/m   Physical Exam  Constitutional: No distress.  HENT:  Head: Normocephalic.  Mouth/Throat: Uvula is midline and mucous membranes are normal. No trismus in the jaw. Posterior oropharyngeal edema and posterior oropharyngeal erythema present. No oropharyngeal exudate or tonsillar abscesses. Tonsils are 2+ on the right. Tonsils are 2+ on the left. No tonsillar exudate.  Eyes: Conjunctivae are normal.  Neck: Neck supple.  Cardiovascular: Normal rate, regular rhythm and normal heart sounds.  Exam reveals no gallop and no friction rub.   No murmur heard. Pulmonary/Chest: Effort normal. No respiratory distress. She  has no wheezes.  Abdominal: Soft. She exhibits no distension.  Lymphadenopathy:    She has no cervical adenopathy.  Neurological: She is alert.  Skin: Skin is warm. No rash noted.  Psychiatric: Her behavior is normal.  Nursing note and vitals reviewed.  ED Treatments / Results  Labs (all labs ordered are listed, but only abnormal results are displayed) Labs Reviewed  RAPID STREP SCREEN (NOT AT [facility]) - Abnormal; Notable for the following:       Result Value   Streptococcus, Group A Screen (Direct) POSITIVE (*)    All other components within normal limits    EKG  EKG Interpretation None       Radiology No results found.  Procedures Procedures (including critical care  time)  Medications Ordered in ED Medications  penicillin g benzathine (BICILLIN LA) 1200000 UNIT/2ML injection 1.2 Million Units (1.2 Million Units Intramuscular Given 07/03/17 1802)  dexamethasone (DECADRON) injection 10 mg (10 mg Intramuscular Given 07/03/17 1759)     Initial Impression / Assessment and Plan / ED Course  I have reviewed the triage vital signs and the nursing notes.  Pertinent labs & imaging results that were available during my care of the patient were reviewed by me and considered in my medical decision making (see chart for details).    26 y.o. patient with 4 days of acute strep pharyngitis. Rapid strep positive. No trismus, drooling. Uvula midline. No woody induration of the neck. Presentation non concerning for PTA or Ludwig's angina, Uvulitis, epiglottitis, peritonsillar abscess, or retropharyngeal abscess. Specific return precautions discussed. Pt able to drink water in ED without difficulty with intact airway. Will d/c the patient with symptomatic treatment. Recommended PCP follow up.   Final Clinical Impressions(s) / ED Diagnoses   Final diagnoses:  Strep pharyngitis    New Prescriptions New Prescriptions   LIDOCAINE (XYLOCAINE) 2 % SOLUTION    Use as directed 15 mLs in the mouth or throat every 3 (three) hours as needed for mouth pain.     Frederik Pear A, PA-C 07/03/17 1819    Vanetta Mulders, MD 07/07/17 [phone]

## 2017-07-03 NOTE — ED Triage Notes (Addendum)
Pt c/o sore throat and swelling to the left tonsil and some difficulty swallowing. Denies shortness of breah, SpO2 98% room air. Pt concerned she may have strep throat, used OTC medications to treat.

## 2017-07-03 NOTE — Discharge Instructions (Signed)
You have been treated for strep throat with penicillin, an antibiotic, and dexamethasone (a steroid). Your symptoms should start to significantly improve in the next 48 hours.  You may swallow 15 mL of viscous lidocaine every 3 hours as needed to help with your sore throat.  If you develop new or worsening symptoms, including feeling as if your throat is closing, difficulty swallowing or handling your secretions, please return to the emergency department for reevaluation.

## 2018-11-28 ENCOUNTER — Ambulatory Visit: Payer: Self-pay | Admitting: Internal Medicine

## 2018-12-16 DIAGNOSIS — L03311 Cellulitis of abdominal wall: Secondary | ICD-10-CM | POA: Diagnosis not present

## 2018-12-16 DIAGNOSIS — L02211 Cutaneous abscess of abdominal wall: Secondary | ICD-10-CM | POA: Diagnosis not present

## 2018-12-16 DIAGNOSIS — Z6841 Body Mass Index (BMI) 40.0 and over, adult: Secondary | ICD-10-CM | POA: Diagnosis not present

## 2018-12-23 DIAGNOSIS — N3090 Cystitis, unspecified without hematuria: Secondary | ICD-10-CM | POA: Diagnosis not present

## 2018-12-23 DIAGNOSIS — N760 Acute vaginitis: Secondary | ICD-10-CM | POA: Diagnosis not present

## 2018-12-23 DIAGNOSIS — R3989 Other symptoms and signs involving the genitourinary system: Secondary | ICD-10-CM | POA: Diagnosis not present

## 2019-01-06 ENCOUNTER — Ambulatory Visit: Payer: Self-pay | Admitting: Nurse Practitioner

## 2019-03-06 DIAGNOSIS — N760 Acute vaginitis: Secondary | ICD-10-CM | POA: Diagnosis not present

## 2019-03-06 DIAGNOSIS — Z113 Encounter for screening for infections with a predominantly sexual mode of transmission: Secondary | ICD-10-CM | POA: Diagnosis not present

## 2019-04-20 DIAGNOSIS — F4321 Adjustment disorder with depressed mood: Secondary | ICD-10-CM | POA: Diagnosis not present

## 2019-04-26 DIAGNOSIS — F4321 Adjustment disorder with depressed mood: Secondary | ICD-10-CM | POA: Diagnosis not present

## 2019-05-09 DIAGNOSIS — Z1159 Encounter for screening for other viral diseases: Secondary | ICD-10-CM | POA: Diagnosis not present

## 2019-05-11 DIAGNOSIS — F4321 Adjustment disorder with depressed mood: Secondary | ICD-10-CM | POA: Diagnosis not present

## 2019-05-13 DIAGNOSIS — N7689 Other specified inflammation of vagina and vulva: Secondary | ICD-10-CM | POA: Diagnosis not present

## 2019-05-13 DIAGNOSIS — R300 Dysuria: Secondary | ICD-10-CM | POA: Diagnosis not present

## 2019-05-17 DIAGNOSIS — F4321 Adjustment disorder with depressed mood: Secondary | ICD-10-CM | POA: Diagnosis not present

## 2019-06-01 DIAGNOSIS — F4321 Adjustment disorder with depressed mood: Secondary | ICD-10-CM | POA: Diagnosis not present

## 2019-06-15 ENCOUNTER — Other Ambulatory Visit: Payer: Self-pay

## 2019-06-15 ENCOUNTER — Encounter (HOSPITAL_COMMUNITY): Payer: Self-pay | Admitting: Emergency Medicine

## 2019-06-15 ENCOUNTER — Emergency Department (HOSPITAL_COMMUNITY)
Admission: EM | Admit: 2019-06-15 | Discharge: 2019-06-15 | Disposition: A | Discharge status: Home / Self Care | Payer: BC Managed Care – PPO | Attending: Emergency Medicine | Admitting: Emergency Medicine

## 2019-06-15 DIAGNOSIS — Y999 Unspecified external cause status: Secondary | ICD-10-CM | POA: Insufficient documentation

## 2019-06-15 DIAGNOSIS — S0181XA Laceration without foreign body of other part of head, initial encounter: Secondary | ICD-10-CM | POA: Diagnosis not present

## 2019-06-15 DIAGNOSIS — Z23 Encounter for immunization: Secondary | ICD-10-CM | POA: Insufficient documentation

## 2019-06-15 DIAGNOSIS — Y9389 Activity, other specified: Secondary | ICD-10-CM | POA: Insufficient documentation

## 2019-06-15 DIAGNOSIS — R55 Syncope and collapse: Secondary | ICD-10-CM | POA: Diagnosis not present

## 2019-06-15 DIAGNOSIS — Y92003 Bedroom of unspecified non-institutional (private) residence as the place of occurrence of the external cause: Secondary | ICD-10-CM | POA: Diagnosis not present

## 2019-06-15 DIAGNOSIS — W01198A Fall on same level from slipping, tripping and stumbling with subsequent striking against other object, initial encounter: Secondary | ICD-10-CM | POA: Insufficient documentation

## 2019-06-15 LAB — COMPREHENSIVE METABOLIC PANEL
ALT: 11 U/L (ref 0–44)
AST: 17 U/L (ref 15–41)
Albumin: 4.5 g/dL (ref 3.5–5.0)
Alkaline Phosphatase: 57 U/L (ref 38–126)
Anion gap: 12 (ref 5–15)
BUN: 8 mg/dL (ref 6–20)
CO2: 22 mmol/L (ref 22–32)
Calcium: 9.5 mg/dL (ref 8.9–10.3)
Chloride: 105 mmol/L (ref 98–111)
Creatinine, Ser: 0.9 mg/dL (ref 0.44–1.00)
GFR calc Af Amer: >60 mL/min (ref >60)
GFR calc non Af Amer: >60 mL/min (ref >60)
Glucose, Bld: 93 mg/dL (ref 70–99)
Potassium: 4 mmol/L (ref 3.5–5.1)
Sodium: 139 mmol/L (ref 135–145)
Total Bilirubin: 1.6 mg/dL — ABNORMAL HIGH (ref 0.3–1.2)
Total Protein: 7.7 g/dL (ref 6.5–8.1)

## 2019-06-15 LAB — CBG MONITORING, ED: Glucose-Capillary: 85 mg/dL (ref 70–99)

## 2019-06-15 LAB — CBC WITH DIFFERENTIAL/PLATELET
Abs Immature Granulocytes: 0.02 10*3/uL (ref 0.00–0.07)
Basophils Absolute: 0 10*3/uL (ref 0.0–0.1)
Basophils Relative: 0 %
Eosinophils Absolute: 0 10*3/uL (ref 0.0–0.5)
Eosinophils Relative: 0 %
HCT: 42 % (ref 36.0–46.0)
Hemoglobin: 13.6 g/dL (ref 12.0–15.0)
Immature Granulocytes: 0 %
Lymphocytes Relative: 25 %
Lymphs Abs: 1.2 10*3/uL (ref 0.7–4.0)
MCH: 31.1 pg (ref 26.0–34.0)
MCHC: 32.4 g/dL (ref 30.0–36.0)
MCV: 95.9 fL (ref 80.0–100.0)
Monocytes Absolute: 0.6 10*3/uL (ref 0.1–1.0)
Monocytes Relative: 12 %
Neutro Abs: 3 10*3/uL (ref 1.7–7.7)
Neutrophils Relative %: 63 %
Platelets: 327 10*3/uL (ref 150–400)
RBC: 4.38 MIL/uL (ref 3.87–5.11)
RDW: 13.3 % (ref 11.5–15.5)
WBC: 4.8 10*3/uL (ref 4.0–10.5)
nRBC: 0 % (ref 0.0–0.2)

## 2019-06-15 LAB — ETHANOL: Alcohol, Ethyl (B): <10 mg/dL (ref <10)

## 2019-06-15 LAB — I-STAT BETA HCG BLOOD, ED (MC, WL, AP ONLY): I-stat hCG, quantitative: <5 m[IU]/mL (ref <5)

## 2019-06-15 MED ORDER — SODIUM CHLORIDE 0.9 % IV BOLUS
500.0000 mL | Freq: Once | INTRAVENOUS | Status: AC
  Administered 2019-06-15: 500 mL via INTRAVENOUS

## 2019-06-15 MED ORDER — LIDOCAINE HCL (PF) 1 % IJ SOLN
30.0000 mL | Freq: Once | INTRAMUSCULAR | Status: AC
  Administered 2019-06-15: 30 mL
  Filled 2019-06-15: qty 30

## 2019-06-15 MED ORDER — TETANUS-DIPHTH-ACELL PERTUSSIS 5-2.5-18.5 LF-MCG/0.5 IM SUSP
0.5000 mL | Freq: Once | INTRAMUSCULAR | Status: AC
  Administered 2019-06-15: 0.5 mL via INTRAMUSCULAR
  Filled 2019-06-15: qty 0.5

## 2019-06-15 MED ORDER — AMOXICILLIN-POT CLAVULANATE 875-125 MG PO TABS: 1.0000 | ORAL_TABLET | Freq: Two times a day (BID) | ORAL | 0 refills | Status: DC

## 2019-06-15 MED ORDER — AMOXICILLIN-POT CLAVULANATE 875-125 MG PO TABS
1.0000 | ORAL_TABLET | Freq: Once | ORAL | Status: AC
  Administered 2019-06-15: 1 via ORAL
  Filled 2019-06-15: qty 1

## 2019-06-15 NOTE — ED Notes (Signed)
Patient verbalizes understanding of discharge instructions. Opportunity for questioning and answers were provided. Armband removed by staff, pt discharged from ED.  

## 2019-06-15 NOTE — ED Triage Notes (Addendum)
Pt arrives via POV from home. States she passed out this morning after waking up. Pt doesn't have recollection of event. Woke up on carpeted floor with chin laceration. Bit her lip and tongue, chipped her tooth. Bleeding controlled. Tetanus up to date. Pt alert, oriented x4, VSS.

## 2019-06-15 NOTE — ED Provider Notes (Addendum)
[facility] EMERGENCY DEPARTMENT Provider Note   CSN: [id number] Arrival date & time: 06/15/19  6073     History   Chief Complaint Chief Complaint  Patient presents with  . Loss of Consciousness  . Laceration    HPI Carrie Hanson is a 28 y.o. female with history of obesity otherwise healthy presents today following syncopal episode and for laceration of the chin.  Patient reports that around 1 AM she was asleep in bed when she heard a loud noise which frightened her, she jumped up out of bed and ran across the room towards the bathroom.  She reports that she then felt lightheaded secondary getting up quickly and lost consciousness falling forward onto her chin and into the wall.  Patient reports that she woke up on the floor, noticed bleeding from her chin and went to the bathroom.  She applied a Band-Aid and then went back to sleep.  Patient reports she has had a mild continuous pain of her chin a throbbing sensation worsened with palpation and without alleviating factors, she reports pain is not severe enough to warrant pain medication.  Patient also reports a chipped right upper tooth and small lip abrasion as result of her fall.  Patient denies any fever/chill, headache/vision changes, neck pain/stiffness, back pain, chest pain, abdominal pain, nausea/vomiting, difficulty speaking, confusion, numbness/weakness, tingling, preceding chest pain/shortness of breath, current chest pain/shortness of breath, cough, extremity swelling/color change, bowel incontinence, bladder incontinence, saddle area paresthesias, urinary retention or any additional concerns today.     HPI  Past Medical History:  Diagnosis Date  . Obesity     There are no active problems to display for this patient.   Past Surgical History:  Procedure Laterality Date  . CHOLECYSTECTOMY    . DILATION AND CURETTAGE OF UTERUS     Abortion      OB History    Gravida  1   Para      Term    Preterm      AB  1   Living        SAB      TAB      Ectopic      Multiple      Live Births               Home Medications    Prior to Admission medications   Medication Sig Start Date End Date Taking? Authorizing Provider  amoxicillin-clavulanate (AUGMENTIN) 875-125 MG tablet Take 1 tablet by mouth every 12 (twelve) hours. 06/15/19   Harlene Salts A, PA-C  lidocaine (XYLOCAINE) 2 % solution Use as directed 15 mLs in the mouth or throat every 3 (three) hours as needed for mouth pain. 07/03/17   McDonald, Mia A, PA-C  metroNIDAZOLE (FLAGYL) 500 MG tablet Take 1 tablet (500 mg total) by mouth 2 (two) times daily. 01/08/17   Romualdo Bolk, MD    Family History Family History  Problem Relation Age of Onset  . Thyroid disease Mother   . Diabetes Mother   . Diabetes Father     Social History Social History   Tobacco Use  . Smoking status: Never Smoker  . Smokeless tobacco: Never Used  Substance Use Topics  . Alcohol use: No    Alcohol/week: 0.0 standard drinks  . Drug use: No     Allergies   Patient has no known allergies.   Review of Systems Review of Systems Ten systems are reviewed and are negative  for acute change except as noted in the HPI  Physical Exam Updated Vital Signs BP 111/61   Pulse 74   Temp 97.8 F (36.6 C) (Oral)   Resp 16   Ht 5\' 5"  (1.651 m)   Wt 122.5 kg   LMP 05/28/2019   SpO2 100%   BMI 44.93 kg/m   Physical Exam Constitutional:      General: She is not in acute distress.    Appearance: Normal appearance. She is well-developed. She is obese. She is not ill-appearing or diaphoretic.  HENT:     Head: Normocephalic. No raccoon eyes or Battle's sign.     Jaw: There is normal jaw occlusion. No trismus.      Comments: 5 cm gaping laceration of the chin    Right Ear: External ear normal.     Left Ear: External ear normal.     Nose: Nose normal.     Mouth/Throat:     Mouth: Mucous membranes are moist.       Comments: Small chip present tooth #5 Eyes:     General: Vision grossly intact. Gaze aligned appropriately.     Extraocular Movements: Extraocular movements intact.     Conjunctiva/sclera: Conjunctivae normal.     Pupils: Pupils are equal, round, and reactive to light.     Comments: No pain with EOM  Neck:     Musculoskeletal: Full passive range of motion without pain, normal range of motion and neck supple.     Trachea: Trachea and phonation normal. No tracheal tenderness or tracheal deviation.  Cardiovascular:     Rate and Rhythm: Normal rate and regular rhythm.     Heart sounds: Normal heart sounds.  Pulmonary:     Effort: Pulmonary effort is normal. No respiratory distress.     Breath sounds: Normal air entry.  Chest:     Chest wall: No tenderness.  Abdominal:     General: There is no distension.     Palpations: Abdomen is soft.     Tenderness: There is no abdominal tenderness. There is no guarding or rebound.  Musculoskeletal: Normal range of motion.     Comments: Patient moves all extremities spontaneously without pain.  Skin:    General: Skin is warm and dry.  Neurological:     Mental Status: She is alert.     GCS: GCS eye subscore is 4. GCS verbal subscore is 5. GCS motor subscore is 6.     Comments: Speech is clear and goal oriented, follows commands Major Cranial nerves without deficit, no facial droop Normal strength in upper and lower extremities bilaterally including dorsiflexion and plantar flexion, strong and equal grip strength Sensation normal to light and sharp touch Moves extremities without ataxia, coordination intact Normal finger to nose and rapid alternating movements Neg romberg, no pronator drift Normal gait  Psychiatric:        Behavior: Behavior normal.    ED Treatments / Results  Labs (all labs ordered are listed, but only abnormal results are displayed) Labs Reviewed  COMPREHENSIVE METABOLIC PANEL - Abnormal; Notable for the following  components:      Result Value   Total Bilirubin 1.6 (*)    All other components within normal limits  CBC WITH DIFFERENTIAL/PLATELET  ETHANOL  I-STAT BETA HCG BLOOD, ED ([facility], [facility], [facility] ONLY)  CBG MONITORING, ED    EKG EKG Interpretation  Date/Time:  Thursday June 15 2019 09:18:40 EDT Ventricular Rate:  102 PR Interval:  136 QRS Duration: 82  QT Interval:  352 QTC Calculation: 458 R Axis:   62 Text Interpretation:  Sinus tachycardia Otherwise normal ECG No previous ECGs available Confirmed by Richardean CanalYao, David H [phone]([id number]) on 06/15/2019 11:44:21 AM   Radiology No results found.  Procedures .Marland Kitchen.Laceration Repair  Date/Time: 06/15/2019 2:31 PM Performed by: Bill SalinasMorelli, Dianna Deshler A, PA-C Authorized by: Bill SalinasMorelli, Dellamae Rosamilia A, PA-C   Consent:    Consent obtained:  Verbal   Consent given by:  Patient   Risks discussed:  Infection, pain, retained foreign body, tendon damage, vascular damage, poor wound healing, poor cosmetic result, need for additional repair and nerve damage Anesthesia (see MAR for exact dosages):    Anesthesia method:  Local infiltration   Local anesthetic:  Lidocaine 1% w/o epi Laceration details:    Location:  Face   Face location:  Chin   Length (cm):  5   Depth (mm):  6 Repair type:    Repair type:  Intermediate Pre-procedure details:    Preparation:  Patient was prepped and draped in usual sterile fashion Exploration:    Hemostasis achieved with:  Direct pressure   Wound exploration: wound explored through full range of motion and entire depth of wound probed and visualized     Wound extent: no foreign bodies/material noted, no muscle damage noted, no nerve damage noted, no tendon damage noted, no underlying fracture noted and no vascular damage noted     Contaminated: no   Treatment:    Area cleansed with:  Betadine and Shur-Clens   Amount of cleaning:  Standard   Irrigation solution:  Sterile saline   Irrigation method:  Pressure wash Subcutaneous repair:     Suture size:  4-0   Suture material:  Plain gut   Suture technique:  Simple interrupted   Number of sutures:  4 Skin repair:    Repair method:  Sutures   Suture size:  6-0   Suture material:  Prolene   Suture technique:  Simple interrupted   Number of sutures:  9 Approximation:    Approximation:  Close Post-procedure details:    Dressing:  Antibiotic ointment, non-adherent dressing and sterile dressing   Patient tolerance of procedure:  Tolerated well, no immediate complications Comments:     Discussed increased risk of infection with patient due to delayed presentation today, she states understanding and consents to suture repair.  Dressing placed by nursing staff. - Unfortunately the left superior edge of the laceration also had an abrasion present and approximately 1 cm of superficial dermis missing, patient stated understanding of repair and in this area may be prone to scarring.  Discussed scar minimization techniques with the patient.   (including critical care time)  Medications Ordered in ED Medications  sodium chloride 0.9 % bolus 500 mL (0 mLs Intravenous Stopped 06/15/19 1308)  lidocaine (PF) (XYLOCAINE) 1 % injection 30 mL (30 mLs Infiltration Given by Other 06/15/19 1452)  Tdap (BOOSTRIX) injection 0.5 mL (0.5 mLs Intramuscular Given 06/15/19 1451)  amoxicillin-clavulanate (AUGMENTIN) 875-125 MG per tablet 1 tablet (1 tablet Oral Given 06/15/19 0000)     Initial Impression / Assessment and Plan / ED Course  I have reviewed the triage vital signs and the nursing notes.  Pertinent labs & imaging results that were available during my care of the patient were reviewed by me and considered in my medical decision making (see chart for details).    Beta-hCG negative Ethanol negative CBG 85 CBC within normal limits CMP nonacute EKG: Sinus tachycardia Otherwise normal ECG No  previous ECGs available Confirmed by Richardean CanalYao, David H ([id number]) on 06/15/2019 11:[id number]:[id number] AM - 28 year old female  presents today after syncopal episode that occurred after getting out of bed quickly around 1 AM last night.  On exam patient afebrile, hemodynamically stable without focal neuro deficits.  Benign cardiopulmonary exam.  Labs and EKG are reassuring.  No family history of sudden cardiac death.  No CHF history.  No chest pain or shortness of breath.  Vital signs stable.  [facility] syncope rule patient is low risk.  Episode was unwitnessed, unsure if possible seizure-like activity will give seizure precautions and referred to outpatient neurology.  No indication for imaging of the head at this time per [place] head CT rule.  Patient without neck pain, no indication for imaging of the neck per [facility] criteria. - Additionally patient with gaping laceration of the chin that is approximately 10-11 hours old on initial evaluation.  Discussed increased risk of infection due to delayed repair with the patient and she wishes to have laceration repaired here in the ED, feel this is reasonable as this wound is gaping and on her face. Wound thoroughly cleaned in ED today. Wound explored and bottom of wound seen in a bloodless field. Laceration repaired as dictated above.  Based on delayed repair will cover patient with Augmentin for infection prevention.  Patient counseled on home wound care. Follow up with PCP/urgent care or return to ER for suture removal in 5 days. Patient was urged to return to the Emergency Department for worsening pain, swelling, expanding erythema especially if it streaks away from the affected area, fever, or for any additional concerns.  As to patient's small chipped tooth she will follow-up with her dentist for this.  Will be covered as well with the Augmentin for her laceration indication further antibiotic therapies.  No sign of dental abscess or other deep tissue infections of the head or neck. - Patient was seen and evaluated by Dr. Silverio LayYao during this visit who agrees with the laceration  repair, Augmentin and referral to neurology for possible first-time seizure; no indication for further work-up or for imaging at this time.  At this time there does not appear to be any evidence of an acute emergency medical condition and the patient appears stable for discharge with appropriate outpatient follow up. Diagnosis was discussed with patient who verbalizes understanding of care plan and is agreeable to discharge. I have discussed return precautions with patient who verbalizes understanding of return precautions. Patient encouraged to follow-up with their PCP and neurology. All questions answered.  Patient has been discharged in good condition.  Patient seen and evaluated by Dr. Silverio LayYao during this visit.  Note: Portions of this report may have been transcribed using voice recognition software. Every effort was made to ensure accuracy; however, inadvertent computerized transcription errors may still be present. Final Clinical Impressions(s) / ED Diagnoses   Final diagnoses:  Syncope and collapse  Facial laceration, initial encounter    ED Discharge Orders         Ordered    amoxicillin-clavulanate (AUGMENTIN) 875-125 MG tablet  Every 12 hours     06/15/19 1441           Bill SalinasMorelli, Lorean Ekstrand A, PA-C 06/15/19 1514    Bill SalinasMorelli, Ohn Bostic A, PA-C 06/15/19 1721    Charlynne PanderYao, David Hsienta, MD 06/17/19 [phone]

## 2019-06-15 NOTE — Discharge Instructions (Addendum)
You have been diagnosed today with syncope and collapse and facial laceration.  At this time there does not appear to be the presence of an emergent medical condition, however there is always the potential for conditions to change. Please read and follow the below instructions.  Please return to the Emergency Department immediately for any new or worsening symptoms or if your symptoms reoccur. Please be sure to follow up with your Primary Care Provider within one week regarding your visit today; please call their office to schedule an appointment even if you are feeling better for a follow-up visit. Please take the antibiotic Augmentin as prescribed to avoid infection of your laceration.  Your sutures need to come out in 5 days, you may have them taken out at an urgent care, your primary care doctor's office or here in the emergency department. Please call the neurologist at [facility] neurology today to schedule a follow-up appointment regarding your syncope and possible seizure today.  Please do not drive or operate heavy machinery or perform any other potential dangerous tasks such as climbing ladders or swimming until you are evaluated and cleared by neurology to avoid injury to yourself or others.  Get help right away if: You have a very bad headache. You pass out once or more than once. You have pain in your chest, belly, or back. You have a very fast or uneven heartbeat (palpitations). It hurts to breathe. You are bleeding from your mouth or your bottom (rectum). You have black or tarry poop (stool). You have jerky movements that you cannot control (seizure). You are confused. You have trouble walking. You are very weak. You have vision problems. You have very bad swelling around your wound. You have pus or a bad smell coming from your wound. Your pain suddenly gets worse and is very bad. You have painful lumps near your wound or anywhere on your body. You have a red streak going away  from your wound. You have any new/concerning or worsening symptoms  Please read the additional information packets attached to your discharge summary.  Do not take your medicine if  develop an itchy rash, swelling in your mouth or lips, or difficulty breathing; call 911 and seek immediate emergency medical attention if this occurs.

## 2019-06-22 ENCOUNTER — Emergency Department (HOSPITAL_COMMUNITY)
Admission: EM | Admit: 2019-06-22 | Discharge: 2019-06-22 | Disposition: A | Discharge status: Home / Self Care | Payer: BC Managed Care – PPO | Attending: Emergency Medicine | Admitting: Emergency Medicine

## 2019-06-22 ENCOUNTER — Encounter (HOSPITAL_COMMUNITY): Payer: Self-pay | Admitting: Emergency Medicine

## 2019-06-22 ENCOUNTER — Other Ambulatory Visit: Payer: Self-pay

## 2019-06-22 DIAGNOSIS — S0181XS Laceration without foreign body of other part of head, sequela: Secondary | ICD-10-CM | POA: Insufficient documentation

## 2019-06-22 DIAGNOSIS — Z79899 Other long term (current) drug therapy: Secondary | ICD-10-CM | POA: Insufficient documentation

## 2019-06-22 DIAGNOSIS — X58XXXS Exposure to other specified factors, sequela: Secondary | ICD-10-CM | POA: Diagnosis not present

## 2019-06-22 DIAGNOSIS — Z4802 Encounter for removal of sutures: Secondary | ICD-10-CM

## 2019-06-22 NOTE — ED Triage Notes (Signed)
Patient presents today for suture removal.

## 2019-06-22 NOTE — Discharge Instructions (Addendum)
Keep the area clean and dry.  Return here as needed. 

## 2019-06-22 NOTE — ED Provider Notes (Signed)
[facility] DEPT Provider Note   CSN: [id number] Arrival date & time: 06/22/19  1031     History   Chief Complaint Chief Complaint  Patient presents with  . Suture / Staple Removal    HPI Carrie Hanson is a 28 y.o. female.     HPI Patient presents to the emergency department with suture removal.  The patient had sutures placed last Thursday.  The patient states that she has had no issues with the area.  She states that is not noted any drainage or redness around the area people Past Medical History:  Diagnosis Date  . Obesity     There are no active problems to display for this patient.   Past Surgical History:  Procedure Laterality Date  . CHOLECYSTECTOMY    . DILATION AND CURETTAGE OF UTERUS     Abortion      OB History    Gravida  1   Para      Term      Preterm      AB  1   Living        SAB      TAB      Ectopic      Multiple      Live Births               Home Medications    Prior to Admission medications   Medication Sig Start Date End Date Taking? Authorizing Provider  amoxicillin-clavulanate (AUGMENTIN) 875-125 MG tablet Take 1 tablet by mouth every 12 (twelve) hours. 06/15/19   Nuala Alpha A, PA-C  lidocaine (XYLOCAINE) 2 % solution Use as directed 15 mLs in the mouth or throat every 3 (three) hours as needed for mouth pain. 07/03/17   McDonald, Mia A, PA-C  metroNIDAZOLE (FLAGYL) 500 MG tablet Take 1 tablet (500 mg total) by mouth 2 (two) times daily. 01/08/17   Salvadore Dom, MD    Family History Family History  Problem Relation Age of Onset  . Thyroid disease Mother   . Diabetes Mother   . Diabetes Father     Social History Social History   Tobacco Use  . Smoking status: Never Smoker  . Smokeless tobacco: Never Used  Substance Use Topics  . Alcohol use: No    Alcohol/week: 0.0 standard drinks  . Drug use: No     Allergies   Patient has no known allergies.    Review of Systems Review of Systems  All other systems negative except as documented in the HPI. All pertinent positives and negatives as reviewed in the HPI. Physical Exam Updated Vital Signs BP 138/79 (BP Location: Left Arm)   Pulse 81   Temp 98.1 F (36.7 C) (Oral)   Resp 16   Ht 5\' 5"  (1.651 m)   Wt 122.5 kg   LMP 05/28/2019   SpO2 100%   BMI 44.94 kg/m   Physical Exam Vitals signs and nursing note reviewed.  Constitutional:      General: She is not in acute distress.    Appearance: She is well-developed.  HENT:     Head: Normocephalic and atraumatic.   Eyes:     Pupils: Pupils are equal, round, and reactive to light.  Pulmonary:     Effort: Pulmonary effort is normal.  Skin:    General: Skin is warm and dry.  Neurological:     Mental Status: She is alert and oriented to person, place, and time.  ED Treatments / Results  Labs (all labs ordered are listed, but only abnormal results are displayed) Labs Reviewed - No data to display  EKG None  Radiology No results found.  Procedures Procedures (including critical care time)  Medications Ordered in ED Medications - No data to display   Initial Impression / Assessment and Plan / ED Course  I have reviewed the triage vital signs and the nursing notes.  Pertinent labs & imaging results that were available during my care of the patient were reviewed by me and considered in my medical decision making (see chart for details).        SUTURE REMOVAL Performed by: Jamesetta Orleanshristopher W Deckard Stuber  Consent: Verbal consent obtained. Patient identity confirmed: provided demographic data Time out: Immediately prior to procedure a "time out" was called to verify the correct patient, procedure, equipment, support staff and site/side marked as required.  Location details: [facility]  Wound Appearance: clean  Sutures/Staples Removed: 6  Facility: sutures placed in this facility Patient tolerance: Patient tolerated the  procedure well with no immediate complications.     Final Clinical Impressions(s) / ED Diagnoses   Final diagnoses:  None    ED Discharge Orders    None       Charlestine NightLawyer, Jerrion Tabbert, PA-C 06/22/19 1201    Mancel BaleWentz, Elliott, MD 06/22/19 1719

## 2019-06-27 ENCOUNTER — Encounter: Payer: Self-pay | Admitting: Family Medicine

## 2019-06-27 ENCOUNTER — Ambulatory Visit: Payer: BC Managed Care – PPO | Admitting: Family Medicine

## 2019-06-27 ENCOUNTER — Other Ambulatory Visit: Payer: Self-pay

## 2019-06-27 VITALS — BP 100/60 | HR 83 | Temp 98.8°F | Ht 65.0 in | Wt 246.4 lb

## 2019-06-27 DIAGNOSIS — R7989 Other specified abnormal findings of blood chemistry: Secondary | ICD-10-CM

## 2019-06-27 DIAGNOSIS — Z8349 Family history of other endocrine, nutritional and metabolic diseases: Secondary | ICD-10-CM

## 2019-06-27 DIAGNOSIS — R55 Syncope and collapse: Secondary | ICD-10-CM

## 2019-06-27 LAB — B12 AND FOLATE PANEL
Folate: 14.9 ng/mL (ref >5.9)
Vitamin B-12: 418 pg/mL (ref 211–911)

## 2019-06-27 LAB — HEPATIC FUNCTION PANEL
ALT: 7 U/L (ref 0–35)
AST: 10 U/L (ref 0–37)
Albumin: 4.1 g/dL (ref 3.5–5.2)
Alkaline Phosphatase: 49 U/L (ref 39–117)
Bilirubin, Direct: 0.2 mg/dL (ref 0.0–0.3)
Total Bilirubin: 0.9 mg/dL (ref 0.2–1.2)
Total Protein: 6.5 g/dL (ref 6.0–8.3)

## 2019-06-27 LAB — BASIC METABOLIC PANEL
BUN: 9 mg/dL (ref 6–23)
CO2: 27 mEq/L (ref 19–32)
Calcium: 9.4 mg/dL (ref 8.4–10.5)
Chloride: 106 mEq/L (ref 96–112)
Creatinine, Ser: 0.74 mg/dL (ref 0.40–1.20)
GFR: 113.29 mL/min (ref >60.00)
Glucose, Bld: 81 mg/dL (ref 70–99)
Potassium: 4.2 mEq/L (ref 3.5–5.1)
Sodium: 140 mEq/L (ref 135–145)

## 2019-06-27 LAB — HEMOGLOBIN A1C: Hgb A1c MFr Bld: 5.2 % (ref 4.6–6.5)

## 2019-06-27 LAB — CBC
HCT: 36.3 % (ref 36.0–46.0)
Hemoglobin: 11.8 g/dL — ABNORMAL LOW (ref 12.0–15.0)
MCHC: 32.6 g/dL (ref 30.0–36.0)
MCV: 94.9 fl (ref 78.0–100.0)
Platelets: 327 10*3/uL (ref 150.0–400.0)
RBC: 3.82 Mil/uL — ABNORMAL LOW (ref 3.87–5.11)
RDW: 14.6 % (ref 11.5–15.5)
WBC: 3.4 10*3/uL — ABNORMAL LOW (ref 4.0–10.5)

## 2019-06-27 LAB — VITAMIN D 25 HYDROXY (VIT D DEFICIENCY, FRACTURES): VITD: 15.77 ng/mL — ABNORMAL LOW (ref 30.00–100.00)

## 2019-06-27 NOTE — Progress Notes (Signed)
Subjective:    Patient ID: Carrie Hanson, female    DOB: Jan 05, 1991, 28 y.o.   MRN: [id number]  HPI   Patient presents to clinic to establish PCP.  Also would like further work-up due to episode of syncope and collapse on 06/15/2019.  Patient states she believes she bit her lip during her sleep, got up to go to the bathroom and next thing she knew she passed out and hit her chin.  Patient did go to the emergency room for sutures and evaluation of the fainting episode.  EKG was done, this was normal.  CBC and CMP were done and unremarkable for any obvious abnormality. I was able to review all ER notes in epic.  Patient states she also did have a similar syncopal episode about a year and a half ago while out at [employer] with friends, states she got up to go to the bar and felt faint, fell back to the floor but did not hit head due to friend being behind her and helping her guide to the floor.  Patient never was worked up for this episode a year and a half ago.  Denies any known history of seizures.  Denies any personal history of heart disease.  States her mother told her that her father had multiple medical issues including diabetes and her mother has thyroid problems and diabetes.  No fever or chills.  No headaches.  No nausea, vomiting or diarrhea.  Past medical, social, surgical and family history reviewed & updated in chart.   CBC Latest Ref Rng & Units 06/15/2019 01/07/2017 01/28/2015  WBC 4.0 - 10.5 K/uL 4.8 3.4(L) 3.8  Hemoglobin 12.0 - 15.0 g/dL 40.913.6 81.112.0 11.9(L)  Hematocrit 36.0 - 46.0 % 42.0 38.8 35.6  Platelets 150 - 400 K/uL 327 422(H) 341   CMP Latest Ref Rng & Units 06/15/2019 01/07/2017 01/28/2015  Glucose 70 - 99 mg/dL 93 94 -  BUN 6 - 20 mg/dL 8 13 -  Creatinine 9.140.44 - 1.00 mg/dL 7.820.90 9.560.80 -  Sodium 213135 - 145 mmol/L 139 141 -  Potassium 3.5 - 5.1 mmol/L 4.0 4.5 -  Chloride 98 - 111 mmol/L 105 108 -  CO2 22 - 32 mmol/L 22 17(L) -  Calcium 8.9 - 10.3 mg/dL 9.5 9.2 -   Total Protein 6.5 - 8.1 g/dL 7.7 6.9 7.3  Total Bilirubin 0.3 - 1.2 mg/dL 0.8(M1.6(H) 0.6 0.8  Alkaline Phos 38 - 126 U/L 57 82 59  AST 15 - 41 U/L 17 19 12(L)  ALT 0 - 44 U/L 11 17 10(L)     Patient Active Problem List   Diagnosis Date Noted  . Syncope 06/27/2019  . Family history of metabolic and nutritional disorder 06/27/2019   Past Surgical History:  Procedure Laterality Date  . CHOLECYSTECTOMY    . DILATION AND CURETTAGE OF UTERUS     Abortion    Family History  Problem Relation Age of Onset  . Thyroid disease Mother   . Diabetes Mother   . Diabetes Father    Social History   Tobacco Use  . Smoking status: Never Smoker  . Smokeless tobacco: Never Used  Substance Use Topics  . Alcohol use: No    Alcohol/week: 0.0 standard drinks    Review of Systems  Constitutional: Negative for chills, fatigue and fever.  HENT: Negative for congestion, ear pain, sinus pain and sore throat.   Eyes: Negative.   Respiratory: Negative for cough, shortness of breath and wheezing.  Cardiovascular: Negative for chest pain, palpitations and leg swelling.  Gastrointestinal: Negative for abdominal pain, diarrhea, nausea and vomiting.  Genitourinary: Negative for dysuria, frequency and urgency.  Musculoskeletal: Negative for arthralgias and myalgias.  Skin: Negative for color change, pallor and rash.  Neurological: Negative for light-headedness and headaches. +syncope 06/15/19, another episode about 1.5 years ago. Psychiatric/Behavioral: The patient is not nervous/anxious.       Objective:   Physical Exam Vitals signs and nursing note reviewed.  Constitutional:      General: She is not in acute distress.    Appearance: She is obese. She is not ill-appearing, toxic-appearing or diaphoretic.  HENT:     Head: Normocephalic.     Comments: Sutures on chin were removed 9/10, area healing well.  Eyes:     General: No scleral icterus.    Extraocular Movements: Extraocular movements intact.      Conjunctiva/sclera: Conjunctivae normal.     Pupils: Pupils are equal, round, and reactive to light.  Neck:     Musculoskeletal: Normal range of motion and neck supple. No neck rigidity.     Thyroid: No thyromegaly or thyroid tenderness.     Vascular: No carotid bruit.     Trachea: Trachea and phonation normal.  Cardiovascular:     Rate and Rhythm: Normal rate and regular rhythm.     Heart sounds: Normal heart sounds. No murmur. No friction rub. No gallop.   Pulmonary:     Effort: Pulmonary effort is normal. No respiratory distress.     Breath sounds: Normal breath sounds.  Musculoskeletal:     Right lower leg: No edema.     Left lower leg: No edema.  Lymphadenopathy:     Cervical: No cervical adenopathy.  Skin:    General: Skin is warm and dry.     Coloration: Skin is not jaundiced or pale.  Neurological:     General: No focal deficit present.     Mental Status: She is alert and oriented to person, place, and time.     Cranial Nerves: No cranial nerve deficit.     Sensory: No sensory deficit.     Motor: No weakness.     Coordination: Coordination normal.     Gait: Gait normal.  Psychiatric:        Mood and Affect: Mood normal.        Behavior: Behavior normal.        Thought Content: Thought content normal.        Judgment: Judgment normal.    Today's Vitals   06/27/19 0839  BP: 100/60  Pulse: 83  Temp: 98.8 F (37.1 C)  TempSrc: Oral  SpO2: 95%  Weight: 246 lb 6.4 oz (111.8 kg)  Height: 5\' 5"  (1.651 m)   Body mass index is 41 kg/m.     Assessment & Plan:    1. Syncope, unspecified syncope type We will do further work-up to investigate syncopal episodes.  We will recheck CBC metabolic panel as well as thyroid, vitamin B12 and vitamin D.  We will also get MRI of brain. Discussed potential referral to neurology for evaluation if all of this blood work and MRI imaging of brain are normal. Discussed healthy diet, regular exercise and being sure to keep up  good fluid intake and eating small meals throughout the day, potential that syncope could have been related to some dehydration or a drop in sugar  - CBC - Basic metabolic panel - Hepatic function panel - Thyroid Panel  With TSH - B12 and Folate Panel - VITAMIN D 25 Hydroxy (Vit-D Deficiency, Fractures) - MR Brain Wo Contrast; Future  2. Family history of metabolic and nutritional disorder Will include thyroid panel and A1c and blood work due to family history of these diseases.  - Thyroid Panel With TSH - Hemoglobin A1c   We will base next follow-up here in office, blood work and MRI imaging.  Patient aware she can call office anytime with questions or concerns.  Declines flu vaccine

## 2019-06-28 LAB — THYROID PANEL WITH TSH
Free Thyroxine Index: 2.2 (ref 1.4–3.8)
T3 Uptake: 27 % (ref 22–35)
T4, Total: 8 ug/dL (ref 5.1–11.9)
TSH: 2.41 mIU/L

## 2019-06-28 MED ORDER — VITAMIN D (ERGOCALCIFEROL) 1.25 MG (50000 UNIT) PO CAPS: 50000.0000 [IU] | ORAL_CAPSULE | ORAL | 0 refills | Status: DC

## 2019-06-28 NOTE — Addendum Note (Signed)
Addended by: Philis Nettle on: 06/28/2019 11:59 AM   Modules accepted: Orders

## 2019-07-17 ENCOUNTER — Other Ambulatory Visit: Payer: BC Managed Care – PPO

## 2019-09-20 ENCOUNTER — Ambulatory Visit: Payer: BC Managed Care – PPO | Admitting: Family Medicine

## 2019-09-25 ENCOUNTER — Encounter: Payer: BC Managed Care – PPO | Admitting: Family

## 2020-07-26 ENCOUNTER — Encounter (INDEPENDENT_AMBULATORY_CARE_PROVIDER_SITE_OTHER): Payer: Self-pay

## 2020-07-26 ENCOUNTER — Encounter: Payer: Self-pay | Admitting: Family

## 2020-07-26 ENCOUNTER — Ambulatory Visit: Payer: 59 | Admitting: Family

## 2020-07-26 ENCOUNTER — Other Ambulatory Visit: Payer: Self-pay

## 2020-07-26 VITALS — BP 104/72 | HR 67 | Temp 98.0°F | Ht 66.0 in | Wt 194.0 lb

## 2020-07-26 DIAGNOSIS — R102 Pelvic and perineal pain unspecified side: Secondary | ICD-10-CM

## 2020-07-26 DIAGNOSIS — R5383 Other fatigue: Secondary | ICD-10-CM | POA: Diagnosis not present

## 2020-07-26 DIAGNOSIS — E559 Vitamin D deficiency, unspecified: Secondary | ICD-10-CM

## 2020-07-26 DIAGNOSIS — D649 Anemia, unspecified: Secondary | ICD-10-CM

## 2020-07-26 LAB — CBC WITH DIFFERENTIAL/PLATELET
Basophils Absolute: 0 10*3/uL (ref 0.0–0.1)
Basophils Relative: 0.4 % (ref 0.0–3.0)
Eosinophils Absolute: 0.1 10*3/uL (ref 0.0–0.7)
Eosinophils Relative: 3.3 % (ref 0.0–5.0)
HCT: 37 % (ref 36.0–46.0)
Hemoglobin: 12.2 g/dL (ref 12.0–15.0)
Lymphocytes Relative: 43.9 % (ref 12.0–46.0)
Lymphs Abs: 1.4 10*3/uL (ref 0.7–4.0)
MCHC: 32.8 g/dL (ref 30.0–36.0)
MCV: 95.9 fl (ref 78.0–100.0)
Monocytes Absolute: 0.4 10*3/uL (ref 0.1–1.0)
Monocytes Relative: 12.2 % — ABNORMAL HIGH (ref 3.0–12.0)
Neutro Abs: 1.3 10*3/uL — ABNORMAL LOW (ref 1.4–7.7)
Neutrophils Relative %: 40.2 % — ABNORMAL LOW (ref 43.0–77.0)
Platelets: 290 10*3/uL (ref 150.0–400.0)
RBC: 3.86 Mil/uL — ABNORMAL LOW (ref 3.87–5.11)
RDW: 13.7 % (ref 11.5–15.5)
WBC: 3.1 10*3/uL — ABNORMAL LOW (ref 4.0–10.5)

## 2020-07-26 LAB — TSH: TSH: 0.83 u[IU]/mL (ref 0.35–4.50)

## 2020-07-26 LAB — COMPREHENSIVE METABOLIC PANEL
ALT: 7 U/L (ref 0–35)
AST: 12 U/L (ref 0–37)
Albumin: 4.1 g/dL (ref 3.5–5.2)
Alkaline Phosphatase: 47 U/L (ref 39–117)
BUN: 9 mg/dL (ref 6–23)
CO2: 30 mEq/L (ref 19–32)
Calcium: 9 mg/dL (ref 8.4–10.5)
Chloride: 107 mEq/L (ref 96–112)
Creatinine, Ser: 0.75 mg/dL (ref 0.40–1.20)
GFR: 107.85 mL/min (ref >60.00)
Glucose, Bld: 91 mg/dL (ref 70–99)
Potassium: 4.6 mEq/L (ref 3.5–5.1)
Sodium: 140 mEq/L (ref 135–145)
Total Bilirubin: 0.8 mg/dL (ref 0.2–1.2)
Total Protein: 7 g/dL (ref 6.0–8.3)

## 2020-07-26 LAB — FERRITIN: Ferritin: 44.3 ng/mL (ref 10.0–291.0)

## 2020-07-26 LAB — VITAMIN D 25 HYDROXY (VIT D DEFICIENCY, FRACTURES): VITD: 11.24 ng/mL — ABNORMAL LOW (ref 30.00–100.00)

## 2020-07-26 NOTE — Progress Notes (Signed)
Carrie Hanson is a 29 y.o. female with the following history as recorded in [employer]:  Patient Active Problem List   Diagnosis Date Noted  . Syncope 06/27/2019  . Family history of metabolic and nutritional disorder 06/27/2019    Current Outpatient Medications  Medication Sig Dispense Refill  . Vitamin D, Ergocalciferol, (DRISDOL) 1.25 MG (50000 UT) CAPS capsule Take 1 capsule (50,000 Units total) by mouth every 7 (seven) days. 12 capsule 0   No current facility-administered medications for this visit.    Allergies: Patient has no known allergies.  Past Medical History:  Diagnosis Date  . Obesity     Past Surgical History:  Procedure Laterality Date  . CHOLECYSTECTOMY    . DILATION AND CURETTAGE OF UTERUS     Abortion     Family History  Problem Relation Age of Onset  . Thyroid disease Mother   . Diabetes Mother   . Diabetes Father     Social History   Tobacco Use  . Smoking status: Never Smoker  . Smokeless tobacco: Never Used  Substance Use Topics  . Alcohol use: No    Alcohol/week: 0.0 standard drinks    Subjective:  Patient presents today as a transfer from the [place] office. Has lost 80 pounds in the past year- changing diet and exercise; Has been having pelvic pain for the past 2 years- periods are normal; LMP- 07/24/2020;  Has had 1 Pfizer through her employer; 2nd vaccine is pending;     Objective:  Vitals:   07/26/20 0908  BP: 104/72  Pulse: 67  Temp: 98 F (36.7 C)  TempSrc: Oral  SpO2: 97%  Weight: 194 lb (88 kg)  Height: _0  (1.676 m)    General: Well developed, well nourished, in no acute distress  Head: Normocephalic and atraumatic  Eyes: Sclera and conjunctiva clear; pupils round and reactive to light; extraocular movements intact  Ears: External normal; canals clear; tympanic membranes normal  Oropharynx: Pink, supple. No suspicious lesions  Neck: Supple without thyromegaly, adenopathy  Lungs: Respirations unlabored; clear  to auscultation bilaterally without wheeze, rales, rhonchi  CVS exam: normal rate and regular rhythm.  Abdomen: Soft; nontender; nondistended; normoactive bowel sounds; no masses or hepatosplenomegaly  Musculoskeletal: No deformities; no active joint inflammation  Extremities: No edema, cyanosis, clubbing  Vessels: Symmetric bilaterally  Neurologic: Alert and oriented; speech intact; face symmetrical; moves all extremities well; CNII-XII intact without focal deficit   Assessment:  1. Pelvic pain   2. Other fatigue   3. Vitamin D deficiency   4. Anemia, unspecified type     Plan:  1. Update pelvic ultrasound today; follow-up to be determined; 2. Check CBC, CMP, TSH;  3. Check Vitamin D level today; 4. Check ferritin level; may need to consider nutrition consult;  Time spent reviewing old labs/ previous information/ discussing treatment plan- 30 minutes  This visit occurred during the SARS-CoV-2 public health emergency.  Safety protocols were in place, including screening questions prior to the visit, additional usage of staff PPE, and extensive cleaning of exam room while observing appropriate contact time as indicated for disinfecting solutions.     No follow-ups on file.  Orders Placed This Encounter  Procedures  . US Pelvic Complete With Transvaginal    Standing Status:   Future    Standing Expiration Date:   07/26/2021    Order Specific Question:   Reason for Exam (SYMPTOM  OR DIAGNOSIS REQUIRED)    Answer:   pelvic pain  Order Specific Question:   Preferred imaging location?    Answer:   [facility]  . CBC with Differential/Platelet    Standing Status:   Future    Standing Expiration Date:   07/26/2021  . Comp Met (CMET)    Standing Status:   Future    Standing Expiration Date:   07/26/2021  . TSH    Standing Status:   Future    Standing Expiration Date:   07/26/2021  . Vitamin D (25 hydroxy)    Standing Status:   Future    Standing Expiration Date:    07/26/2021  . Ferritin    Standing Status:   Future    Standing Expiration Date:   07/26/2021    Requested Prescriptions    No prescriptions requested or ordered in this encounter

## 2020-07-30 ENCOUNTER — Other Ambulatory Visit: Payer: Self-pay | Admitting: Family

## 2020-07-30 DIAGNOSIS — R7989 Other specified abnormal findings of blood chemistry: Secondary | ICD-10-CM

## 2020-07-30 MED ORDER — VITAMIN D (ERGOCALCIFEROL) 1.25 MG (50000 UNIT) PO CAPS: 50000.0000 [IU] | ORAL_CAPSULE | ORAL | 0 refills | Status: AC

## 2020-08-07 ENCOUNTER — Other Ambulatory Visit: Payer: 59

## 2020-10-21 ENCOUNTER — Emergency Department (HOSPITAL_COMMUNITY)
Admission: EM | Admit: 2020-10-21 | Discharge: 2020-10-21 | Disposition: A | Discharge status: Home / Self Care | Payer: 59 | Attending: Emergency Medicine | Admitting: Emergency Medicine

## 2020-10-21 DIAGNOSIS — R350 Frequency of micturition: Secondary | ICD-10-CM | POA: Diagnosis not present

## 2020-10-21 DIAGNOSIS — R109 Unspecified abdominal pain: Secondary | ICD-10-CM | POA: Insufficient documentation

## 2020-10-21 DIAGNOSIS — Z5321 Procedure and treatment not carried out due to patient leaving prior to being seen by health care provider: Secondary | ICD-10-CM | POA: Diagnosis not present

## 2020-10-21 LAB — URINALYSIS, ROUTINE W REFLEX MICROSCOPIC
Bilirubin Urine: NEGATIVE
Glucose, UA: NEGATIVE mg/dL
Hgb urine dipstick: NEGATIVE
Ketones, ur: NEGATIVE mg/dL
Leukocytes,Ua: NEGATIVE
Nitrite: NEGATIVE
Protein, ur: NEGATIVE mg/dL
Specific Gravity, Urine: 1.015 (ref 1.005–1.030)
pH: 7 (ref 5.0–8.0)

## 2020-10-21 LAB — CBC WITH DIFFERENTIAL/PLATELET
Abs Immature Granulocytes: 0 10*3/uL (ref 0.00–0.07)
Basophils Absolute: 0 10*3/uL (ref 0.0–0.1)
Basophils Relative: 0 %
Eosinophils Absolute: 0 10*3/uL (ref 0.0–0.5)
Eosinophils Relative: 1 %
HCT: 41.7 % (ref 36.0–46.0)
Hemoglobin: 12.8 g/dL (ref 12.0–15.0)
Lymphocytes Relative: 64 %
Lymphs Abs: 2 10*3/uL (ref 0.7–4.0)
MCH: 30.5 pg (ref 26.0–34.0)
MCHC: 30.7 g/dL (ref 30.0–36.0)
MCV: 99.3 fL (ref 80.0–100.0)
Monocytes Absolute: 0.3 10*3/uL (ref 0.1–1.0)
Monocytes Relative: 8 %
Neutro Abs: 0.9 10*3/uL — ABNORMAL LOW (ref 1.7–7.7)
Neutrophils Relative %: 27 %
Platelets: 318 10*3/uL (ref 150–400)
RBC: 4.2 MIL/uL (ref 3.87–5.11)
RDW: 13.9 % (ref 11.5–15.5)
Smear Review: NORMAL
WBC: 3.2 10*3/uL — ABNORMAL LOW (ref 4.0–10.5)
nRBC: 0 % (ref 0.0–0.2)

## 2020-10-21 LAB — COMPREHENSIVE METABOLIC PANEL
ALT: 9 U/L (ref 0–44)
AST: 19 U/L (ref 15–41)
Albumin: 4.1 g/dL (ref 3.5–5.0)
Alkaline Phosphatase: 43 U/L (ref 38–126)
Anion gap: 11 (ref 5–15)
BUN: 9 mg/dL (ref 6–20)
CO2: 24 mmol/L (ref 22–32)
Calcium: 9.3 mg/dL (ref 8.9–10.3)
Chloride: 104 mmol/L (ref 98–111)
Creatinine, Ser: 0.78 mg/dL (ref 0.44–1.00)
GFR, Estimated: >60 mL/min (ref >60)
Glucose, Bld: 88 mg/dL (ref 70–99)
Potassium: 4.6 mmol/L (ref 3.5–5.1)
Sodium: 139 mmol/L (ref 135–145)
Total Bilirubin: 2 mg/dL — ABNORMAL HIGH (ref 0.3–1.2)
Total Protein: 7.1 g/dL (ref 6.5–8.1)

## 2020-10-21 LAB — I-STAT BETA HCG BLOOD, ED (MC, WL, AP ONLY): I-stat hCG, quantitative: <5 m[IU]/mL (ref <5)

## 2020-10-21 LAB — LIPASE, BLOOD: Lipase: 22 U/L (ref 11–51)

## 2020-10-21 NOTE — ED Triage Notes (Signed)
Pt here from home with c/o abd pain and freq with urination and still feels like she needs to urinate after she goes to the bathroom , no bleeding or discharge noted

## 2020-10-21 NOTE — ED Notes (Signed)
Called Pt for vitals and to be roomed no answer.

## 2020-10-22 ENCOUNTER — Emergency Department (HOSPITAL_COMMUNITY): Payer: 59

## 2020-10-22 ENCOUNTER — Emergency Department (HOSPITAL_COMMUNITY)
Admission: EM | Admit: 2020-10-22 | Discharge: 2020-10-22 | Disposition: A | Discharge status: Home / Self Care | Payer: 59 | Attending: Emergency Medicine | Admitting: Emergency Medicine

## 2020-10-22 ENCOUNTER — Other Ambulatory Visit: Payer: Self-pay

## 2020-10-22 ENCOUNTER — Encounter (HOSPITAL_COMMUNITY): Payer: Self-pay | Admitting: Emergency Medicine

## 2020-10-22 DIAGNOSIS — N739 Female pelvic inflammatory disease, unspecified: Secondary | ICD-10-CM | POA: Insufficient documentation

## 2020-10-22 DIAGNOSIS — N730 Acute parametritis and pelvic cellulitis: Secondary | ICD-10-CM

## 2020-10-22 DIAGNOSIS — R1030 Lower abdominal pain, unspecified: Secondary | ICD-10-CM | POA: Diagnosis present

## 2020-10-22 LAB — WET PREP, GENITAL
Clue Cells Wet Prep HPF POC: NONE SEEN
Sperm: NONE SEEN
Trich, Wet Prep: NONE SEEN
Yeast Wet Prep HPF POC: NONE SEEN

## 2020-10-22 LAB — POC URINE PREG, ED: Preg Test, Ur: NEGATIVE

## 2020-10-22 MED ORDER — KETOROLAC TROMETHAMINE 30 MG/ML IJ SOLN
30.0000 mg | Freq: Once | INTRAMUSCULAR | Status: AC
  Administered 2020-10-22: 30 mg via INTRAVENOUS
  Filled 2020-10-22: qty 1

## 2020-10-22 MED ORDER — IOHEXOL 300 MG/ML  SOLN
100.0000 mL | Freq: Once | INTRAMUSCULAR | Status: AC | PRN
  Administered 2020-10-22: 100 mL via INTRAVENOUS

## 2020-10-22 MED ORDER — DOXYCYCLINE HYCLATE 50 MG PO CAPS: 100.0000 mg | ORAL_CAPSULE | Freq: Two times a day (BID) | ORAL | 0 refills | Status: AC

## 2020-10-22 MED ORDER — LIDOCAINE HCL 2 % IJ SOLN
INTRAMUSCULAR | Status: AC
  Filled 2020-10-22: qty 20

## 2020-10-22 MED ORDER — CEFTRIAXONE SODIUM 1 G IJ SOLR
500.0000 mg | Freq: Once | INTRAMUSCULAR | Status: AC
  Administered 2020-10-22: 500 mg via INTRAMUSCULAR
  Filled 2020-10-22: qty 10

## 2020-10-22 MED ORDER — DICYCLOMINE HCL 10 MG PO CAPS: 10.0000 mg | ORAL_CAPSULE | Freq: Four times a day (QID) | ORAL | 0 refills | Status: DC

## 2020-10-22 MED ORDER — METRONIDAZOLE 500 MG PO TABS: 500.0000 mg | ORAL_TABLET | Freq: Two times a day (BID) | ORAL | 0 refills | Status: AC

## 2020-10-22 MED ORDER — IBUPROFEN 200 MG PO TABS: 600.0000 mg | ORAL_TABLET | Freq: Four times a day (QID) | ORAL | 0 refills | Status: DC | PRN

## 2020-10-22 NOTE — ED Notes (Signed)
Patient transported to CT 

## 2020-10-22 NOTE — ED Triage Notes (Signed)
Per pt, states she was at [facility] for the same symptoms yesterday-abdominal pain radiating to back for weeks-states some nausea-states her tests were negative but she is still having pain and wants to know shy

## 2020-10-22 NOTE — Discharge Instructions (Addendum)
The CT of your abdomen did not show any abnormalities, so it is likely an infection (pelvic inflammatory disease) causing your symptoms which we will treat with antibiotics.   You were evaluated for lower abdominal and back pain. We treated you with Toradol to help with your pain.   We completed a transvaginal ultrasound that showed a right ovarian simple cyst.    We also completed a vaginal/cervical swab to look for any infection that may be causing pelvic inflammatory disease. The results of these tests will take a while to result so we will treat you with antibiotics empirically.   Please take doxycycline and metronidazole twice daily for 14 days. We will give a dose of ceftriaxone once before leaving the ED.   For pain, you can take ibuprofen 600mg  every 6-8 hours as needed. We have also prescribed a medication called, Bentyl, for any abdominal pain.   It is important that you follow up with your primary care physician as well as with gynecology for further evaluation.

## 2020-10-22 NOTE — ED Provider Notes (Signed)
[facility]-EMERGENCY DEPT Provider Note   CSN: [id number] Arrival date & time: 10/22/20  0093     History Chief Complaint  Patient presents with  . Abdominal Pain    Carrie Hanson is a 30 y.o. female.  Patient presents with 3-day history of suprapubic/abdominal and lower back pain.  States that pain was initially in her lower abdomen and radiates straight through to her back.  She states that the pain onset was sudden and persistent.  She denies any fevers or chills.  She reports nausea with no episodes of emesis.  Patient last menstrual period was December 17, patient states that this was abnormally light for her normal schedule.  She reports prior menses that were regular.  She reports her last sexual encounter was in November.  Patient has sex with a man only.  Patient does not use contraception.  Pregnancy test was -1/2 unit [facility], ED.  Patient reports some increased urinary frequency, denies hematuria.  She denies any dysuria.  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  She denies any shortness of breath or palpitations nor cough.  Patient reports being constipated recently with the usual bowel habits once daily or every other day.  She denies frequent consumption of NSAIDs.  She reports having ibuprofen this morning.  She denies drug use.  She occasionally drinks wine less than twice per month.  She has had no recent gatherings with preprepared meals.  Patient reports having her gallbladder removed 4-5 years ago.        Past Medical History:  Diagnosis Date  . Obesity     Patient Active Problem List   Diagnosis Date Noted  . Syncope 06/27/2019  . Family history of metabolic and nutritional disorder 06/27/2019    Past Surgical History:  Procedure Laterality Date  . CHOLECYSTECTOMY    . DILATION AND CURETTAGE OF UTERUS     Abortion      OB History    Gravida  1   Para      Term       Preterm      AB  1   Living        SAB      IAB      Ectopic      Multiple      Live Births              Family History  Problem Relation Age of Onset  . Thyroid disease Mother   . Diabetes Mother   . Diabetes Father     Social History   Tobacco Use  . Smoking status: Never Smoker  . Smokeless tobacco: Never Used  Substance Use Topics  . Alcohol use: No    Alcohol/week: 0.0 standard drinks  . Drug use: No    Home Medications Prior to Admission medications   Medication Sig Start Date End Date Taking? Authorizing Provider  dicyclomine (BENTYL) 10 MG capsule Take 1 capsule (10 mg total) by mouth 4 (four) times daily for 14 days. 10/22/20 11/05/20 Yes Simmons-Robinson, Shawndra Clute, MD  doxycycline (VIBRAMYCIN) 50 MG capsule Take 2 capsules (100 mg total) by mouth 2 (two) times daily for 14 days. 10/22/20 11/05/20 Yes Simmons-Robinson, Kaelyn Innocent, MD  metroNIDAZOLE (FLAGYL) 500 MG tablet Take 1 tablet (500 mg total) by mouth 2 (two) times daily for 14 days. 10/22/20 11/05/20 Yes Simmons-Robinson, Nuriyah Hanline, MD  polyvinyl alcohol (LIQUIFILM TEARS) 1.4 % ophthalmic  solution Place 1 drop into both eyes as needed for dry eyes.   Yes [provider]  ibuprofen (ADVIL) 200 MG tablet Take 3 tablets (600 mg total) by mouth every 6 (six) hours as needed for fever, headache or mild pain. 10/22/20   Simmons-Robinson, Jairus Tonne, MD  Vitamin D, Ergocalciferol, (DRISDOL) 1.25 MG (50000 UT) CAPS capsule Take 1 capsule (50,000 Units total) by mouth every 7 (seven) days. Patient not taking: Reported on 10/22/2020 06/28/19   Tracey Harries, FNP    Allergies    Patient has no known allergies.  Review of Systems   Review of Systems  Constitutional: Negative for chills and fever.  Respiratory: Negative for cough, choking, shortness of breath and wheezing.   Cardiovascular: Negative for palpitations.  Gastrointestinal: Positive for abdominal pain, constipation and nausea. Negative for  anal bleeding, blood in stool, diarrhea and vomiting.  Genitourinary: Positive for frequency. Negative for decreased urine volume, difficulty urinating, dysuria, hematuria and vaginal bleeding.    Physical Exam Updated Vital Signs BP 90/78   Pulse 74   Temp 99.5 F (37.5 C) (Oral)   Resp 17   LMP 09/21/2020   SpO2 100%   Physical Exam Constitutional:      General: She is not in acute distress.    Appearance: She is well-developed. She is not toxic-appearing.  Cardiovascular:     Rate and Rhythm: Normal rate and regular rhythm.     Heart sounds: No murmur heard.   Pulmonary:     Effort: Pulmonary effort is normal. No respiratory distress.     Breath sounds: Normal breath sounds. No wheezing or rales.  Abdominal:     General: A surgical scar is present. Bowel sounds are decreased.     Palpations: Abdomen is soft.     Tenderness: There is generalized abdominal tenderness and tenderness in the right upper quadrant, right lower quadrant, suprapubic area and left lower quadrant. There is no rebound.     Comments: Excess abdominal skin   Genitourinary:    Vagina: Vaginal discharge present.     Cervix: Cervical motion tenderness present.     Uterus: Tender.      Adnexa:        Right: No mass, tenderness or fullness.         Left: No mass, tenderness or fullness.    Skin:    General: Skin is warm and dry.     Coloration: Skin is not cyanotic or mottled.     Findings: No erythema.  Neurological:     General: No focal deficit present.     Mental Status: She is alert and oriented to person, place, and time.     ED Results / Procedures / Treatments   Labs (all labs ordered are listed, but only abnormal results are displayed) Labs Reviewed  WET PREP, GENITAL - Abnormal; Notable for the following components:      Result Value   WBC, Wet Prep HPF POC MANY (*)    All other components within normal limits  POC URINE PREG, ED  WET PREP  (BD AFFIRM) ([place])  GC/CHLAMYDIA  PROBE AMP ([place]) NOT AT [facility]    EKG None  Radiology US Pelvis Complete  Result Date: 10/22/2020 CLINICAL DATA:  Cervical motion tenderness. EXAM: TRANSABDOMINAL ULTRASOUND OF PELVIS TECHNIQUE: Transabdominal ultrasound examination of the pelvis was performed including evaluation of the uterus, ovaries, adnexal regions, and pelvic cul-de-sac. COMPARISON:  None. FINDINGS: Uterus Measurements: 6.3 x 4.1 x 5.6  cm. No fibroids or other mass visualized. Endometrium Thickness: 14.4 mm. Mild endometrial prominence. No focal abnormalities identified. Right ovary Measurements: 4.1 x 2.5 x 4.1 cm. Small simple appearing cyst, most likely follicular, the largest measures 1.4 cm. Left ovary Measurements: Not visualized Other findings:  Small amount of free fluid. IMPRESSION: 1. Small simple appearing cysts right ovary. Nonvisualization of the left ovary. 2. Small amount of free pelvic fluid. Electronically Signed   By: Maisie Fus  Register   On: 10/22/2020 11:02   CT Abdomen Pelvis W Contrast  Result Date: 10/22/2020 CLINICAL DATA:  Pelvic pain.  Nausea.  Seen yesterday for same. EXAM: CT ABDOMEN AND PELVIS WITH CONTRAST TECHNIQUE: Multidetector CT imaging of the abdomen and pelvis was performed using the standard protocol following bolus administration of intravenous contrast. CONTRAST:  OMNIPAQUE IOHEXOL 300 MG/ML  SOLN COMPARISON:  Pelvic ultrasound earlier today. FINDINGS: Lower chest: No focal airspace disease or pleural fluid. Hepatobiliary: Tiny hypodensity adjacent to the falciform ligament may represent focal fatty infiltration or tiny cyst, benign. Status post cholecystectomy. Slight central intrahepatic biliary prominence in the left lobe. Common bile duct is normal in caliber. Pancreas: No ductal dilatation or inflammation. Spleen: Normal in size without focal abnormality. Adrenals/Urinary Tract: Normal adrenal glands. No hydronephrosis or perinephric edema. Homogeneous renal enhancement.  Urinary bladder is physiologically distended without wall thickening. Stomach/Bowel: Bowel evaluation is limited in the absence of enteric contrast. Decompressed stomach. Normal positioning of the duodenum and ligament of Treitz. Majority of the small bowel is decompressed. No obstruction or wall thickening. Normal appendix, series 5, image 38. No appendicitis. Moderate colonic stool burden. No colonic wall thickening or inflammation. Vascular/Lymphatic: Normal caliber abdominal aorta. Patent portal vein. No acute vascular findings. There is no abdominopelvic adenopathy. Reproductive: The uterus is anteverted, unremarkable. The ovaries are not well-defined by CT, tentatively visualized ovaries are unremarkable. No suspicious adnexal mass. Small amount of simple free fluid in the dependent pelvis, likely physiologic. Other: No upper abdominal ascites. No free air. No body wall hernia. Musculoskeletal: There are no acute or suspicious osseous abnormalities. IMPRESSION: 1. No acute abnormality in the abdomen/pelvis. 2. Moderate colonic stool burden, can be seen with constipation. 3. Small amount of simple free fluid in the pelvis, likely physiologic. Electronically Signed   By: Narda Rutherford M.D.   On: 10/22/2020 15:08    Procedures Procedures (including critical care time)  Medications Ordered in ED Medications  cefTRIAXone (ROCEPHIN) injection 500 mg (has no administration in time range)  ketorolac (TORADOL) 30 MG/ML injection 30 mg (30 mg Intravenous Given 10/22/20 1051)  iohexol (OMNIPAQUE) 300 MG/ML solution 100 mL (100 mLs Intravenous Contrast Given 10/22/20 1450)    ED Course  I have reviewed the triage vital signs and the nursing notes.  Pertinent labs & imaging results that were available during my care of the patient were reviewed by me and considered in my medical decision making (see chart for details).  Clinical Course as of 10/22/20 1529  Tue Oct 22, 2020  0911 Sperm: NONE SEEN Wet  prep with several WBC otherwise normal. Ordered Toradol for pain management prior to pelvic ultrasound [MS]  1038 Patient reports improved pain after toradol. On repeat abdominal exam still with tenderness in lower quadrants and more concentrated tenderness in suprapubic region. Abd/pelvic CT ordered.  [MS]    Clinical Course User Index [MS] Ronnald Ramp, MD   MDM Rules/Calculators/A&P  August LuzJessica S Fleming is a 30 year old female presenting with 3-4 days of suprapubic and back pain. Patient noted to have hx of pelvic pain with prior plan to undergo outpatient pelvic ultrasound that was not completed. Patient has negative pregnancy test as of 10/21/20, unremarkable urine studies and normal metabolic panel. Pelvic exam concerning for erythematous cervix and presence of CMT, normal wet prep, will test for gon/chlamydia. Pelvic ultrasound completed and notable for small simple cyst in right ovary, unable to visualize left ovary with small amount of free fluid. Differential includes PID, fallopian tube pathology such as abscess, considered ectopic pregnancy/rupture however, pregnancy test [facility] Low suspicion for urinary or renal etiology of pain given localization of pain and recent normal urine studies. Pain improved after toradol, abdominal exam with persistent tenderness to palpation in suprapubic, left and right lower quadrants. Abdominal/pelvis CT showed no abnormalities. Patient reported no pain after toradol and was discharged home with CTX IM, doxycycline and metronidazole with instructions for PCP and GYN follow up.   Final Clinical Impression(s) / ED Diagnoses Final diagnoses:  PID (acute pelvic inflammatory disease)    Rx / DC Orders ED Discharge Orders         Ordered    metroNIDAZOLE (FLAGYL) 500 MG tablet  2 times daily        10/22/20 1514    doxycycline (VIBRAMYCIN) 50 MG capsule  2 times daily        10/22/20  1514    dicyclomine (BENTYL) 10 MG capsule  4 times daily        10/22/20 1519    ibuprofen (ADVIL) 200 MG tablet  Every 6 hours PRN        10/22/20 1519           Simmons-Robinson, Tawanna CoolerMakiera, MD 10/22/20 1529    Maia PlanLong, Joshua G, MD 10/24/20 1023

## 2020-10-24 LAB — GC/CHLAMYDIA PROBE AMP (~~LOC~~) NOT AT ARMC
Chlamydia: NEGATIVE
Comment: NEGATIVE
Comment: NORMAL
Neisseria Gonorrhea: NEGATIVE

## 2021-05-29 IMAGING — US US PELVIS COMPLETE
1 series · 14 of 25 positions shown · non-contrast
Comparison: None.

CLINICAL DATA: Cervical motion tenderness.

EXAM:
TRANSABDOMINAL ULTRASOUND OF PELVIS
TECHNIQUE: Transabdominal ultrasound examination of the pelvis was performed
including evaluation of the uterus, ovaries, adnexal regions, and
pelvic cul-de-sac.

[Series 1: us pelvis complete · 14 of 45 slices shown]
[im 1/45]
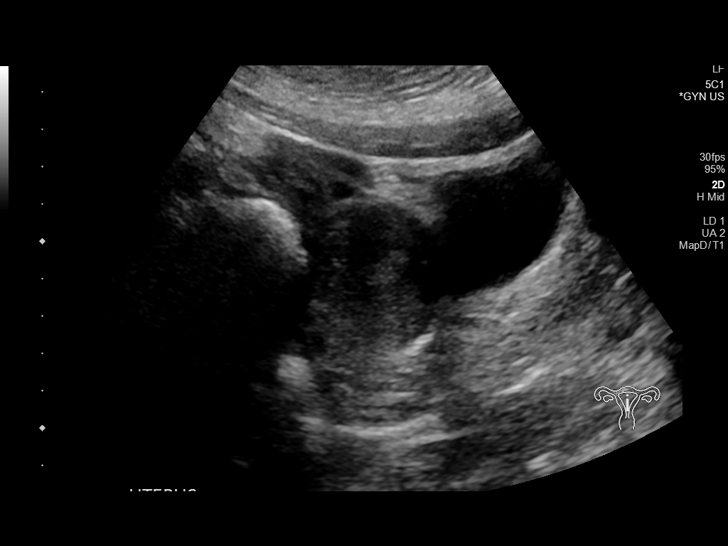
[im 4/45]
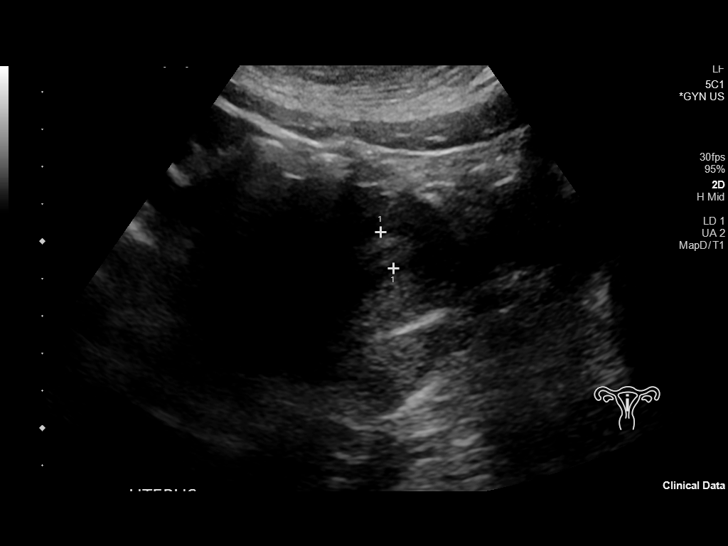
[im 8/45]
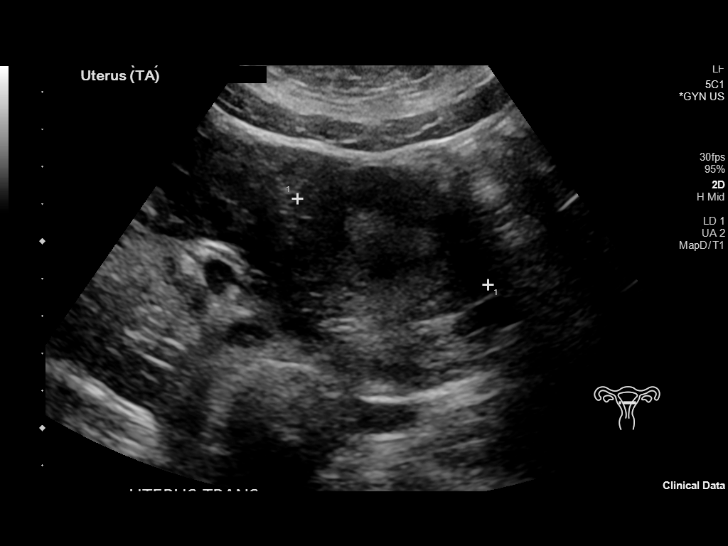
[im 12/45]
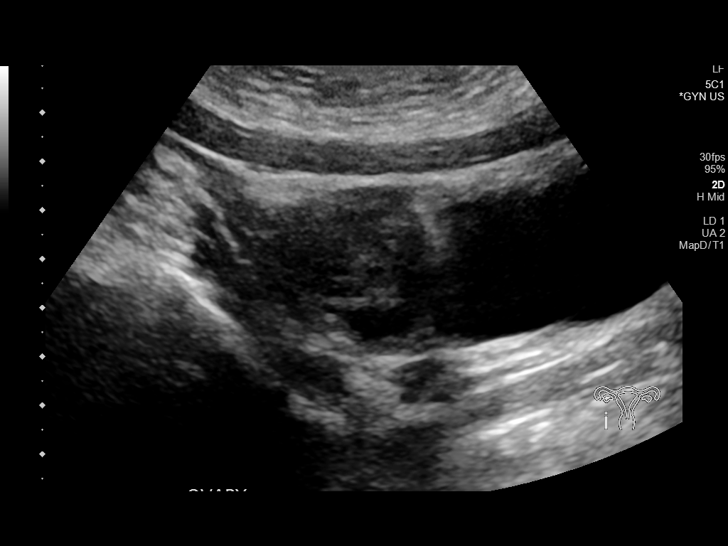
[im 15/45]
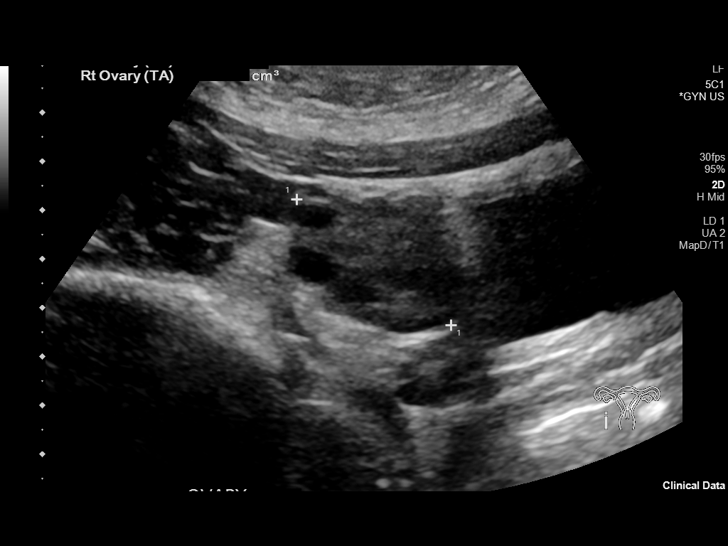
[im 17/45]
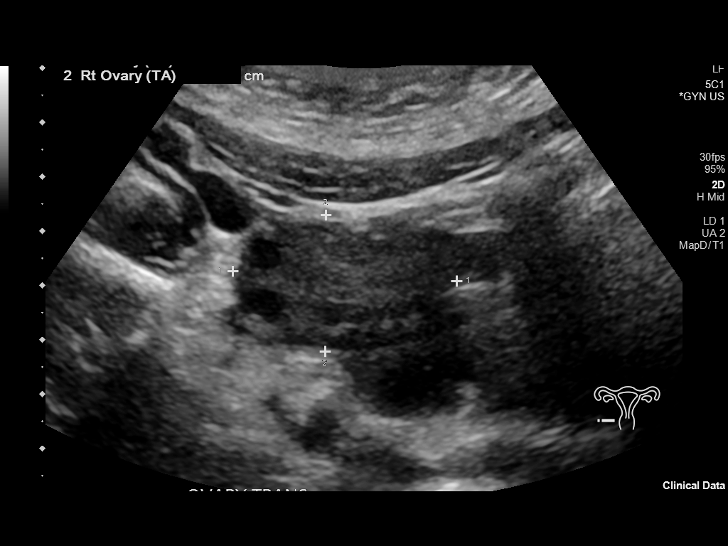
[im 21/45]
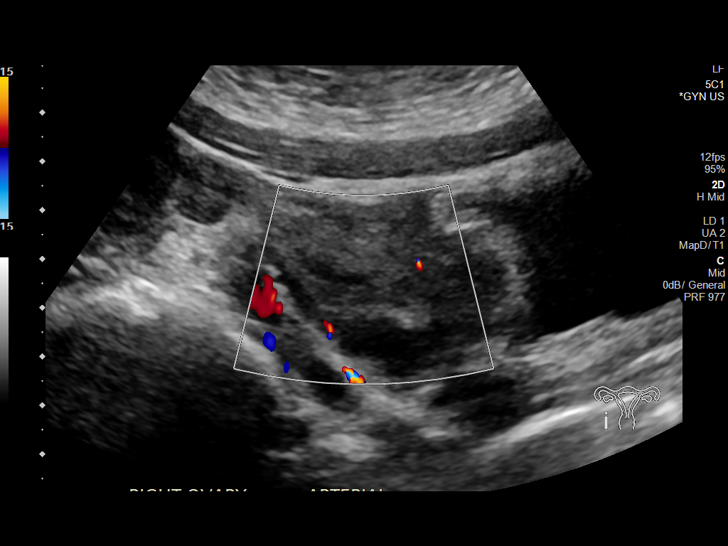
[im 24/45]
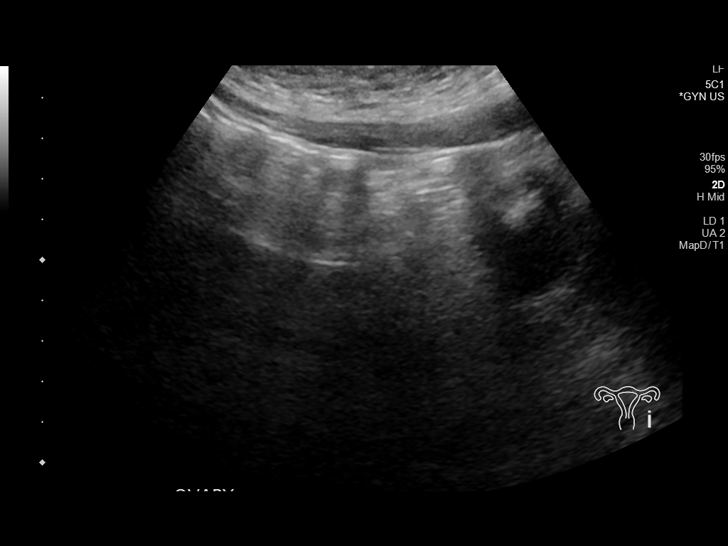
[im 28/45]
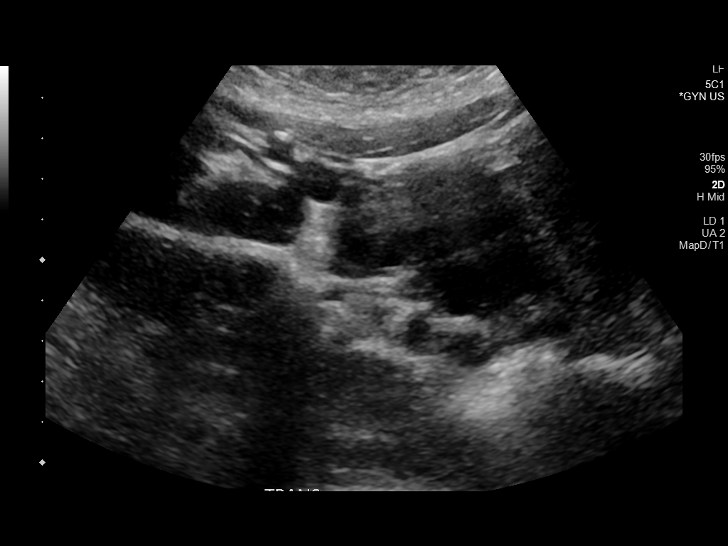
[im 30/45]
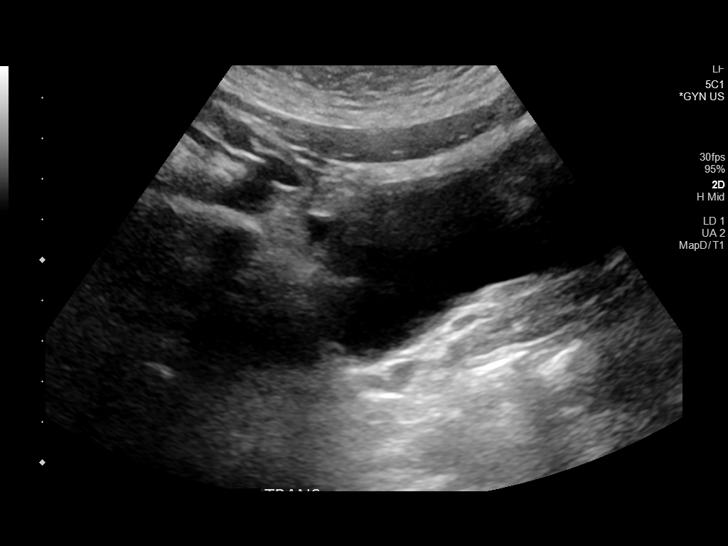
[im 34/45]
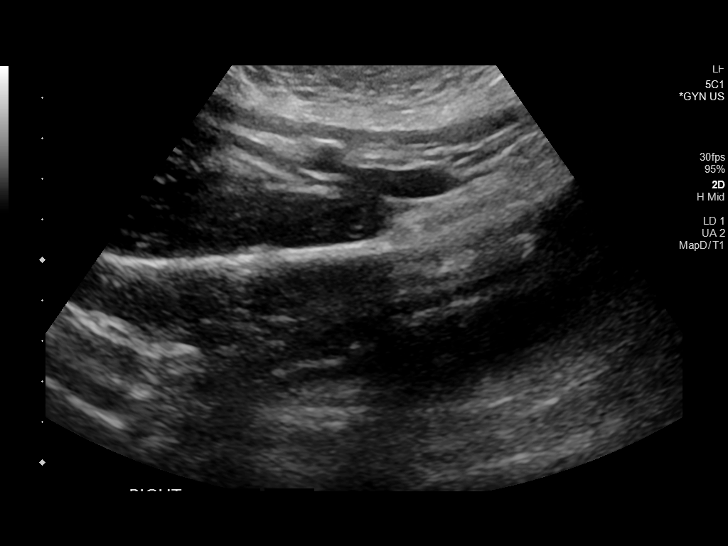
[im 37/45]
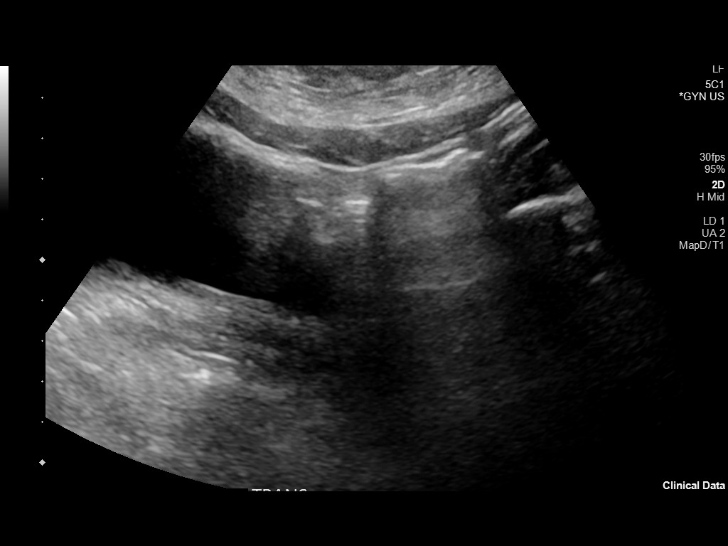
[im 41/45]
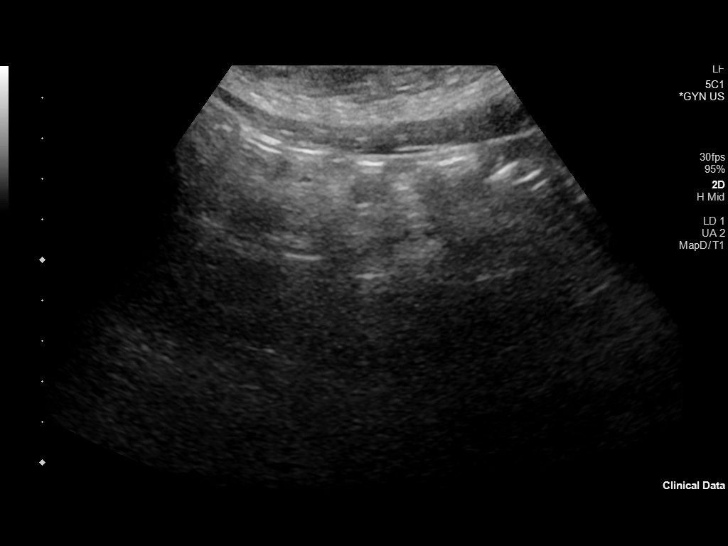
[im 45/45]
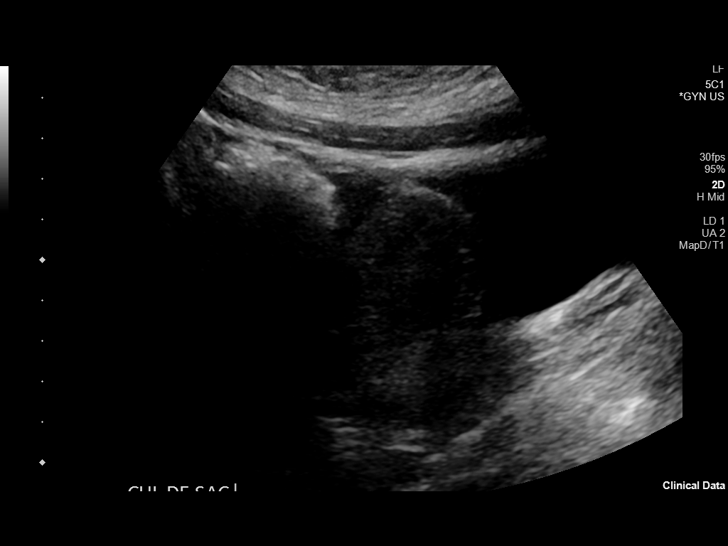

[14 of 25 positions shown; findings below may reference images not displayed]

FINDINGS: Uterus

Measurements: 6.3 x 4.1 x 5.6 cm. No fibroids or other mass
visualized.

Endometrium

Thickness: 14.4 mm. Mild endometrial prominence. No focal
abnormalities identified.

Right ovary

Measurements: 4.1 x 2.5 x 4.1 cm. Small simple appearing cyst, most
likely follicular, the largest measures 1.4 cm.

Left ovary

Measurements: Not visualized

Other findings:  Small amount of free fluid.
IMPRESSION: 1. Small simple appearing cysts right ovary. Nonvisualization of the
left ovary.
2. Small amount of free pelvic fluid.

## 2021-05-29 IMAGING — CT CT ABD-PELV W/ CM
2 of 4 series · 16 of 46 positions shown, 18 images · IV contrast (omnipaque)
Comparison: Pelvic ultrasound earlier today.

CLINICAL DATA: Pelvic pain.  Nausea.  Seen yesterday for same.

EXAM:
CT ABDOMEN AND PELVIS WITH CONTRAST
TECHNIQUE: Multidetector CT imaging of the abdomen and pelvis was performed
using the standard protocol following bolus administration of
intravenous contrast.
CONTRAST:  100mL OMNIPAQUE IOHEXOL 300 MG/ML  SOLN

[Series 2: axial st · axial · 0.81mm/px · z∈[-700,-280]mm · 13 of 94 slices shown, 15 images]
[im 5/94  soft-tissue]
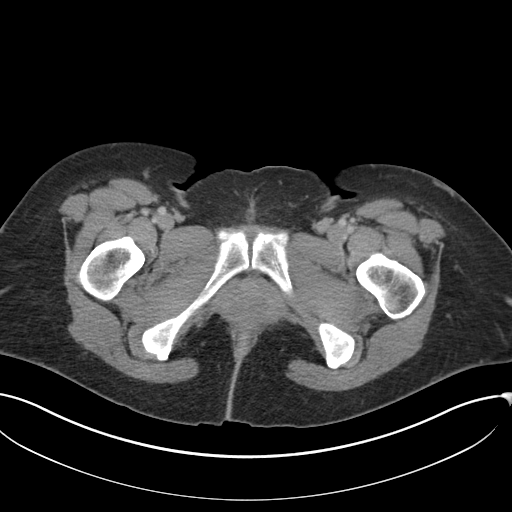
[im 5/94  bone]
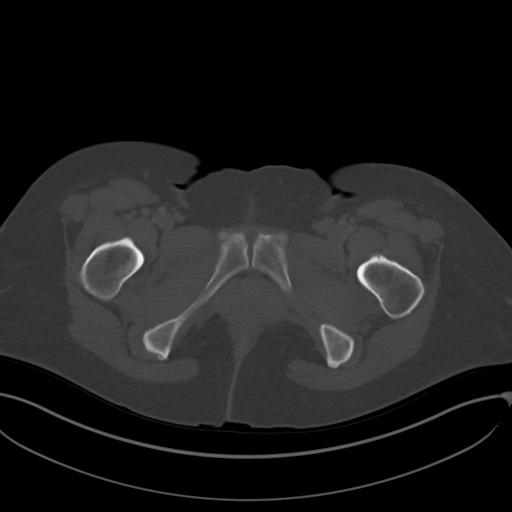
[im 15/94  soft-tissue]
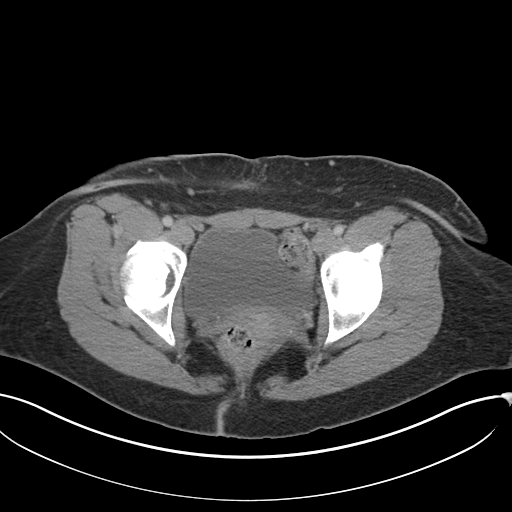
[im 20/94  soft-tissue]
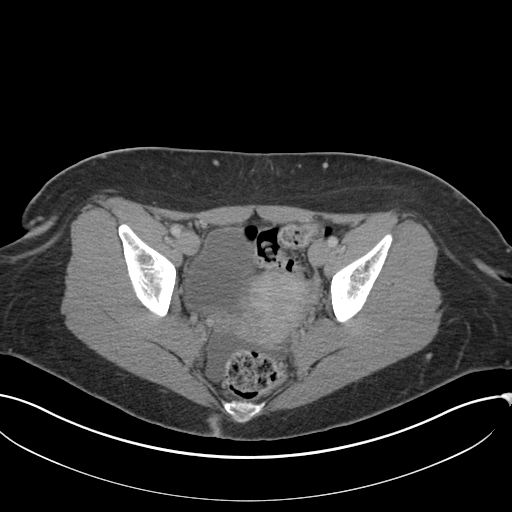
[im 25/94  soft-tissue]
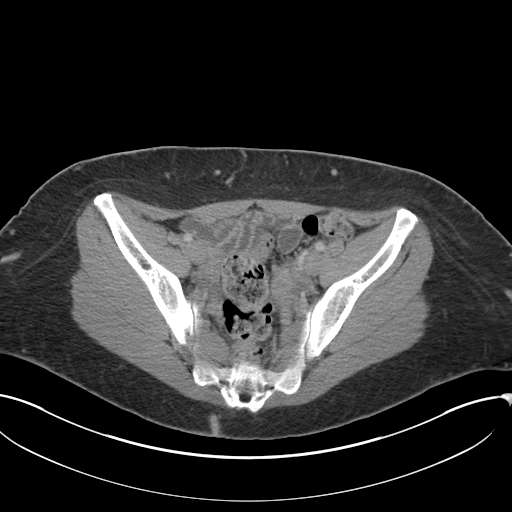
[im 35/94  soft-tissue]
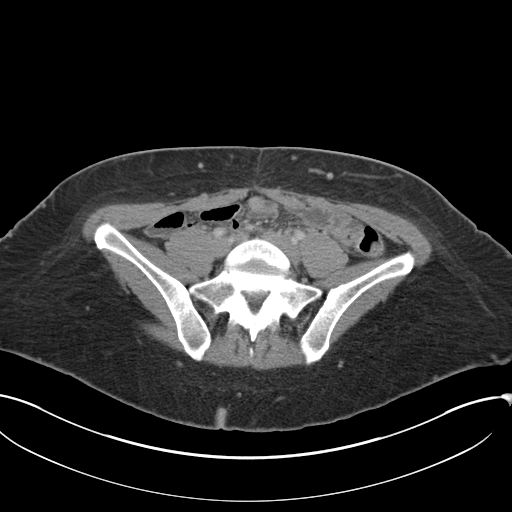
[im 40/94  soft-tissue]
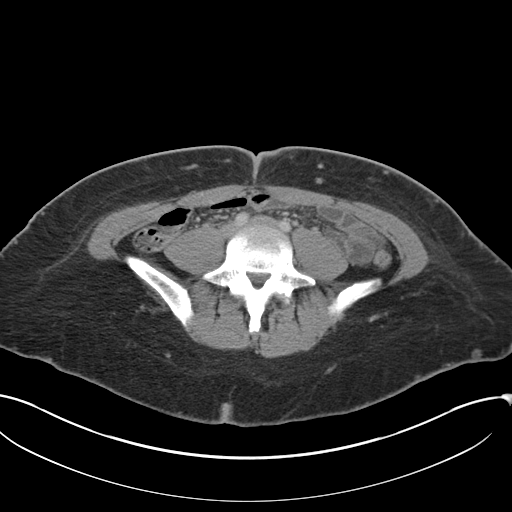
[im 49/94  soft-tissue]
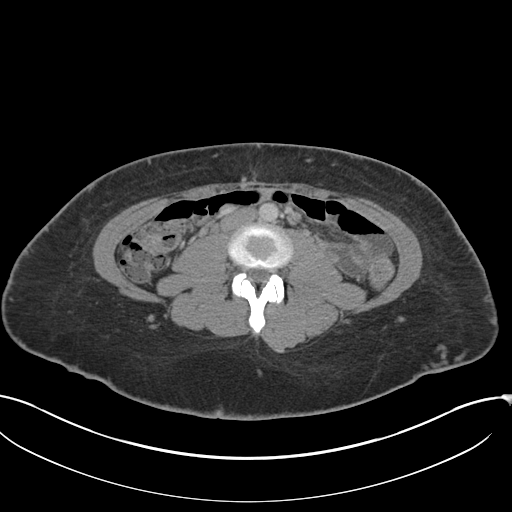
[im 54/94  soft-tissue]
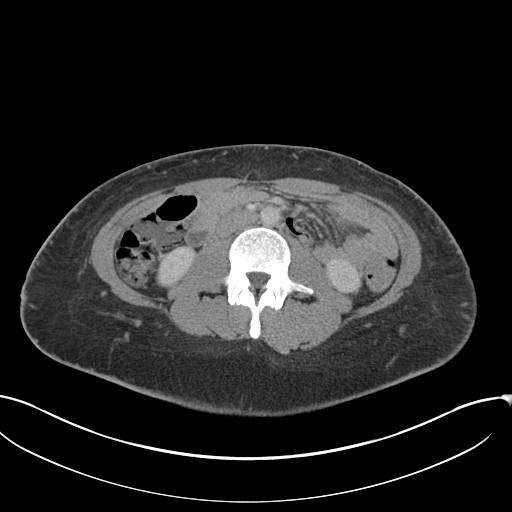
[im 59/94  soft-tissue]
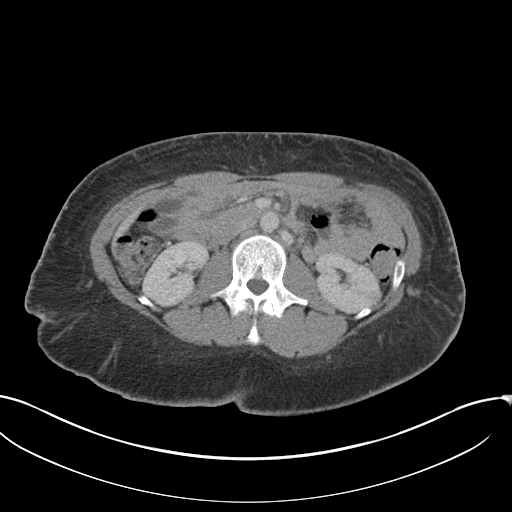
[im 59/94  bone]
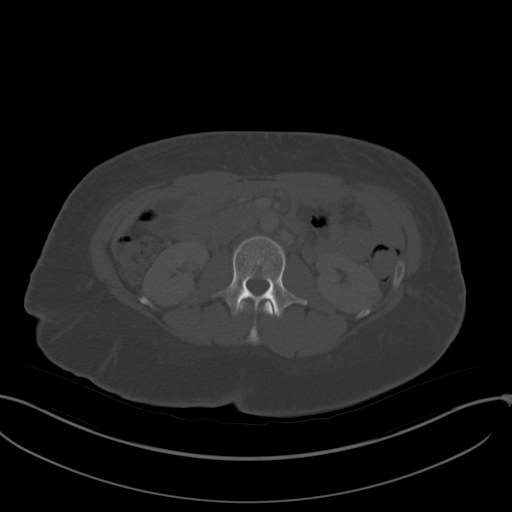
[im 69/94  soft-tissue]
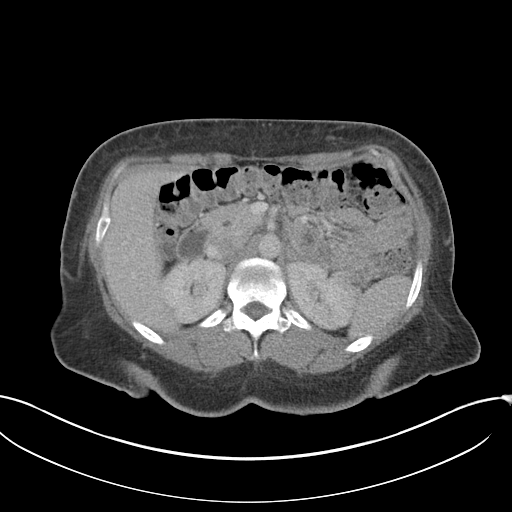
[im 74/94  soft-tissue]
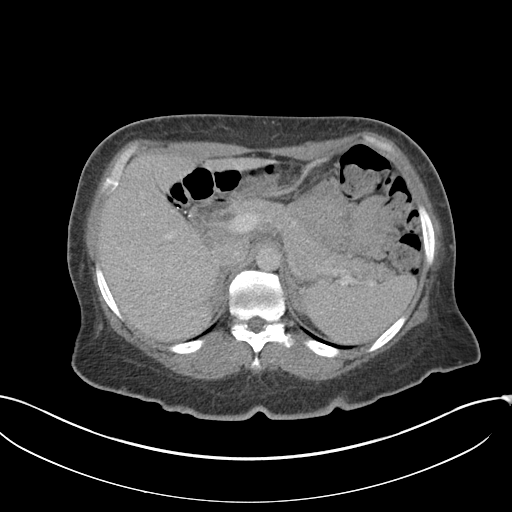
[im 79/94  soft-tissue]
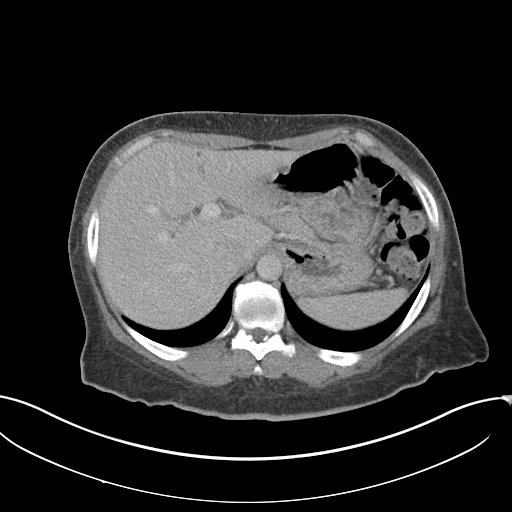
[im 89/94  soft-tissue]
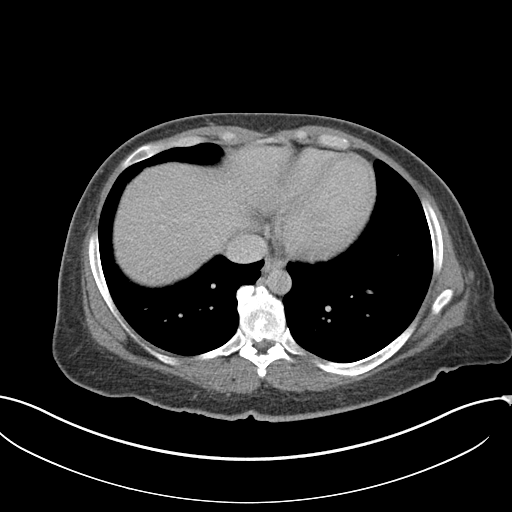

[Series 5: coronal st · coronal · 0.83mm/px · 3 of 122 slices shown]
[im 41/122  soft-tissue]
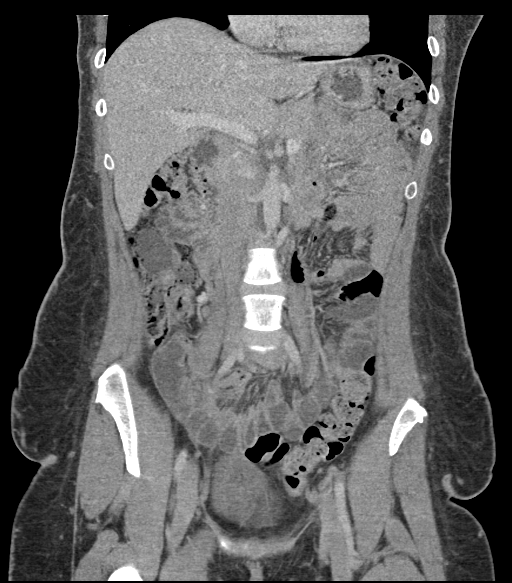
[im 54/122  soft-tissue]
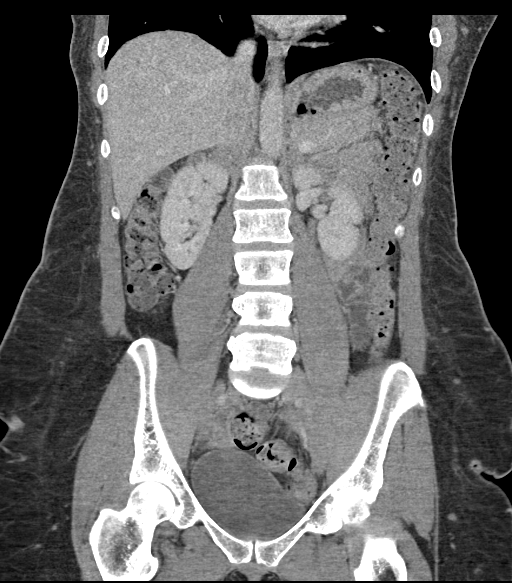
[im 68/122  soft-tissue]
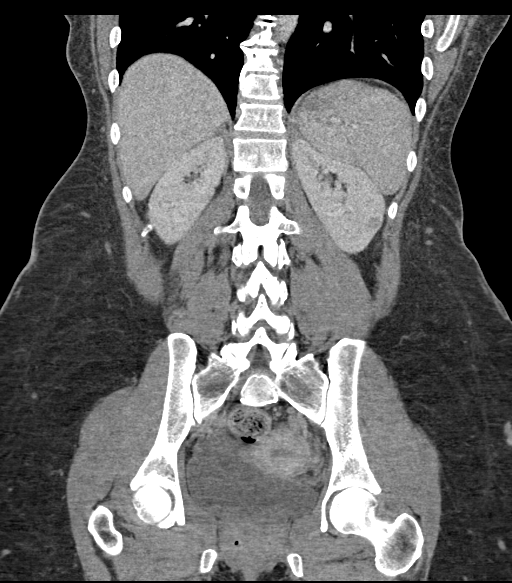

[16 of 46 positions shown; findings below may reference images not displayed]

FINDINGS: Lower chest: No focal airspace disease or pleural fluid.

Hepatobiliary: Tiny hypodensity adjacent to the falciform ligament
may represent focal fatty infiltration or tiny cyst, benign. Status
post cholecystectomy. Slight central intrahepatic biliary prominence
in the left lobe. Common bile duct is normal in caliber.

Pancreas: No ductal dilatation or inflammation.

Spleen: Normal in size without focal abnormality.

Adrenals/Urinary Tract: Normal adrenal glands. No hydronephrosis or
perinephric edema. Homogeneous renal enhancement. Urinary bladder is
physiologically distended without wall thickening.

Stomach/Bowel: Bowel evaluation is limited in the absence of enteric
contrast. Decompressed stomach. Normal positioning of the duodenum
and ligament of Treitz. Majority of the small bowel is decompressed.
No obstruction or wall thickening. Normal appendix, series 5, image
38. No appendicitis. Moderate colonic stool burden. No colonic wall
thickening or inflammation.

Vascular/Lymphatic: Normal caliber abdominal aorta. Patent portal
vein. No acute vascular findings. There is no abdominopelvic
adenopathy.

Reproductive: The uterus is anteverted, unremarkable. The ovaries
are not well-defined by CT, tentatively visualized ovaries are
unremarkable. No suspicious adnexal mass. Small amount of simple
free fluid in the dependent pelvis, likely physiologic.

Other: No upper abdominal ascites. No free air. No body wall hernia.

Musculoskeletal: There are no acute or suspicious osseous
abnormalities.
IMPRESSION: 1. No acute abnormality in the abdomen/pelvis.
2. Moderate colonic stool burden, can be seen with constipation.
3. Small amount of simple free fluid in the pelvis, likely
physiologic.

## 2021-06-26 ENCOUNTER — Other Ambulatory Visit: Payer: Self-pay

## 2021-06-26 ENCOUNTER — Ambulatory Visit
Admission: EM | Admit: 2021-06-26 | Discharge: 2021-06-26 | Disposition: A | Discharge status: Home / Self Care | Payer: Managed Care, Other (non HMO) | Attending: Emergency Medicine | Admitting: Emergency Medicine

## 2021-06-26 DIAGNOSIS — R300 Dysuria: Secondary | ICD-10-CM

## 2021-06-26 DIAGNOSIS — Z8619 Personal history of other infectious and parasitic diseases: Secondary | ICD-10-CM | POA: Diagnosis not present

## 2021-06-26 DIAGNOSIS — N898 Other specified noninflammatory disorders of vagina: Secondary | ICD-10-CM | POA: Diagnosis not present

## 2021-06-26 DIAGNOSIS — Z112 Encounter for screening for other bacterial diseases: Secondary | ICD-10-CM | POA: Diagnosis not present

## 2021-06-26 LAB — POCT URINALYSIS DIP (MANUAL ENTRY)
Glucose, UA: NEGATIVE mg/dL
Leukocytes, UA: NEGATIVE
Nitrite, UA: NEGATIVE
Spec Grav, UA: >=1.03 — AB (ref 1.010–1.025)
Urobilinogen, UA: 1 E.U./dL
pH, UA: 5.5 (ref 5.0–8.0)

## 2021-06-26 LAB — POCT URINE PREGNANCY: Preg Test, Ur: NEGATIVE

## 2021-06-26 NOTE — Discharge Instructions (Addendum)
Your urine does not show signs of infection.  Your urine pregnancy test is negative.    Your vaginal tests are pending.  If your test results are positive, we will call you.  You and your sexual partner(s) may require treatment at that time.  Do not have sexual activity for at least 7 days.    Follow up with your primary care provider if your symptoms are not improving.       

## 2021-06-26 NOTE — ED Provider Notes (Signed)
Carrie Hanson    CSN: [id number] Arrival date & time: 06/26/21  1524      History   Chief Complaint Chief Complaint  Patient presents with   Vaginal Discharge    HPI LUCAS EXLINE is a 30 y.o. female.  Patient presents with 1 week history of cream-colored vaginal discharge.  She also reports dysuria, urinary frequency, suprapubic discomfort, and low back pain.  Treatment at home with Azo and Monistat.  She denies fever, chills, pelvic pain, vomiting, diarrhea, or other symptoms.  She reports history of chlamydia and bacterial vaginitis.  Her medical history also includes obesity and syncope.  The history is provided by the patient and medical records.   Past Medical History:  Diagnosis Date   Obesity     Patient Active Problem List   Diagnosis Date Noted   Syncope 06/27/2019   Family history of metabolic and nutritional disorder 06/27/2019    Past Surgical History:  Procedure Laterality Date   CHOLECYSTECTOMY     DILATION AND CURETTAGE OF UTERUS     Abortion     OB History     Gravida  1   Para      Term      Preterm      AB  1   Living         SAB      IAB      Ectopic      Multiple      Live Births               Home Medications    Prior to Admission medications   Medication Sig Start Date End Date Taking? Authorizing Provider  ibuprofen (ADVIL) 200 MG tablet Take 3 tablets (600 mg total) by mouth every 6 (six) hours as needed for fever, headache or mild pain. 10/22/20  Yes Simmons-Robinson, Makiera, MD  dicyclomine (BENTYL) 10 MG capsule Take 1 capsule (10 mg total) by mouth 4 (four) times daily for 14 days. 10/22/20 11/05/20  Simmons-Robinson, Tawanna Cooler, MD  polyvinyl alcohol (LIQUIFILM TEARS) 1.4 % ophthalmic solution Place 1 drop into both eyes as needed for dry eyes.    [provider]  Vitamin D, Ergocalciferol, (DRISDOL) 1.25 MG (50000 UT) CAPS capsule Take 1 capsule (50,000 Units total) by mouth every 7 (seven)  days. Patient not taking: No sig reported 06/28/19   Tracey Harries, FNP    Family History Family History  Problem Relation Age of Onset   Thyroid disease Mother    Diabetes Mother    Diabetes Father     Social History Social History   Tobacco Use   Smoking status: Never   Smokeless tobacco: Never  Vaping Use   Vaping Use: Never used  Substance Use Topics   Alcohol use: Yes    Comment: occ   Drug use: No     Allergies   Patient has no known allergies.   Review of Systems Review of Systems  Constitutional:  Negative for chills and fever.  Respiratory:  Negative for cough and shortness of breath.   Cardiovascular:  Negative for chest pain and palpitations.  Gastrointestinal:  Positive for abdominal pain. Negative for diarrhea and vomiting.  Genitourinary:  Positive for dysuria, frequency and vaginal discharge. Negative for flank pain, hematuria and pelvic pain.  Musculoskeletal:  Positive for back pain. Negative for gait problem.  Skin:  Negative for color change and rash.  All other systems reviewed and are negative.  Physical Exam Triage Vital Signs ED Triage Vitals  Enc Vitals Group     BP      Pulse      Resp      Temp      Temp src      SpO2      Weight      Height      Head Circumference      Peak Flow      Pain Score      Pain Loc      Pain Edu?      Excl. in GC?    No data found.  Updated Vital Signs BP 122/79 (BP Location: Left Arm)   Pulse 70   Temp 98 F (36.7 C) (Oral)   Resp 18   LMP 06/19/2021 (Exact Date)   SpO2 97%   Visual Acuity Right Eye Distance:   Left Eye Distance:   Bilateral Distance:    Right Eye Near:   Left Eye Near:    Bilateral Near:     Physical Exam Vitals and nursing note reviewed.  Constitutional:      General: She is not in acute distress.    Appearance: She is well-developed. She is not ill-appearing.  HENT:     Head: Normocephalic and atraumatic.     Mouth/Throat:     Mouth: Mucous membranes  are moist.  Eyes:     Conjunctiva/sclera: Conjunctivae normal.  Cardiovascular:     Rate and Rhythm: Normal rate and regular rhythm.     Heart sounds: Normal heart sounds.  Pulmonary:     Effort: Pulmonary effort is normal. No respiratory distress.     Breath sounds: Normal breath sounds.  Abdominal:     Palpations: Abdomen is soft.     Tenderness: There is no abdominal tenderness. There is no right CVA tenderness, left CVA tenderness, guarding or rebound.  Musculoskeletal:     Cervical back: Neck supple.  Skin:    General: Skin is warm and dry.  Neurological:     General: No focal deficit present.     Mental Status: She is alert and oriented to person, place, and time.     Gait: Gait normal.  Psychiatric:        Mood and Affect: Mood normal.        Behavior: Behavior normal.     UC Treatments / Results  Labs (all labs ordered are listed, but only abnormal results are displayed) Labs Reviewed  POCT URINALYSIS DIP (MANUAL ENTRY) - Abnormal; Notable for the following components:      Result Value   Clarity, UA cloudy (*)    Bilirubin, UA small (*)    Ketones, POC UA small (15) (*)    Spec Grav, UA >=1.030 (*)    Blood, UA small (*)    Protein Ur, POC trace (*)    All other components within normal limits  POCT URINE PREGNANCY  CERVICOVAGINAL ANCILLARY ONLY    EKG   Radiology No results found.  Procedures Procedures (including critical care time)  Medications Ordered in UC Medications - No data to display  Initial Impression / Assessment and Plan / UC Course  I have reviewed the triage vital signs and the nursing notes.  Pertinent labs & imaging results that were available during my care of the patient were reviewed by me and considered in my medical decision making (see chart for details).  Vaginal discharge, dysuria.  Urine pregnancy negative.  Urine does  not show signs of infection.  Instructed patient to increase her water intake.  Patient obtained vaginal  self swab for testing.  Discussed with patient that she may require treatment if her test results are positive.  Discussed that her sexual partner may also require treatment.  Instructed her to abstain from sexual activity for at least 7 days.  Instructed her to follow-up with her PCP or OB/GYN if her symptoms are not improving.  Patient agrees to plan of care.    Final Clinical Impressions(s) / UC Diagnoses   Final diagnoses:  Vaginal discharge  Dysuria     Discharge Instructions      Your urine does not show signs of infection.  Your urine pregnancy test is negative.      Your vaginal tests are pending.  If your test results are positive, we will call you.  You and your sexual partner(s) may require treatment at that time.  Do not have sexual activity for at least 7 days.    Follow up with your primary care provider if your symptoms are not improving.           ED Prescriptions   None    PDMP not reviewed this encounter.   Mickie Bail, NP 06/26/21 [phone]

## 2021-06-26 NOTE — ED Triage Notes (Signed)
Pt presents with vaginal discharge x 1 week then back pain starting 3 days ago. OTC were not helpful.  Reports urinary frequency, dysuria.  Unsure of hematuria. Pt also reports lower abdomen pressure in the last week.    Back pain described as midline and intermittent, sometimes stopping her in tracks.

## 2021-06-30 LAB — CERVICOVAGINAL ANCILLARY ONLY
Bacterial Vaginitis (gardnerella): NEGATIVE
Candida Glabrata: NEGATIVE
Candida Vaginitis: NEGATIVE
Chlamydia: NEGATIVE
Comment: NEGATIVE
Comment: NEGATIVE
Comment: NEGATIVE
Comment: NEGATIVE
Comment: NEGATIVE
Comment: NORMAL
Neisseria Gonorrhea: NEGATIVE
Trichomonas: NEGATIVE

## 2021-07-31 ENCOUNTER — Emergency Department (HOSPITAL_COMMUNITY)
Admission: EM | Admit: 2021-07-31 | Discharge: 2021-07-31 | Disposition: A | Discharge status: Home / Self Care | Payer: Managed Care, Other (non HMO) | Attending: Emergency Medicine | Admitting: Emergency Medicine

## 2021-07-31 ENCOUNTER — Encounter (HOSPITAL_COMMUNITY): Payer: Self-pay

## 2021-07-31 ENCOUNTER — Emergency Department (HOSPITAL_COMMUNITY): Payer: Managed Care, Other (non HMO)

## 2021-07-31 ENCOUNTER — Other Ambulatory Visit: Payer: Self-pay

## 2021-07-31 DIAGNOSIS — N939 Abnormal uterine and vaginal bleeding, unspecified: Secondary | ICD-10-CM | POA: Insufficient documentation

## 2021-07-31 DIAGNOSIS — R300 Dysuria: Secondary | ICD-10-CM | POA: Diagnosis not present

## 2021-07-31 DIAGNOSIS — R109 Unspecified abdominal pain: Secondary | ICD-10-CM | POA: Diagnosis present

## 2021-07-31 LAB — CBC WITH DIFFERENTIAL/PLATELET
Abs Immature Granulocytes: 0.02 10*3/uL (ref 0.00–0.07)
Basophils Absolute: 0.1 10*3/uL (ref 0.0–0.1)
Basophils Relative: 1 %
Eosinophils Absolute: 0.1 10*3/uL (ref 0.0–0.5)
Eosinophils Relative: 3 %
HCT: 39 % (ref 36.0–46.0)
Hemoglobin: 12.5 g/dL (ref 12.0–15.0)
Immature Granulocytes: 0 %
Lymphocytes Relative: 43 %
Lymphs Abs: 2 10*3/uL (ref 0.7–4.0)
MCH: 31.6 pg (ref 26.0–34.0)
MCHC: 32.1 g/dL (ref 30.0–36.0)
MCV: 98.7 fL (ref 80.0–100.0)
Monocytes Absolute: 0.5 10*3/uL (ref 0.1–1.0)
Monocytes Relative: 11 %
Neutro Abs: 2 10*3/uL (ref 1.7–7.7)
Neutrophils Relative %: 42 %
Platelets: 341 10*3/uL (ref 150–400)
RBC: 3.95 MIL/uL (ref 3.87–5.11)
RDW: 13.8 % (ref 11.5–15.5)
WBC: 4.7 10*3/uL (ref 4.0–10.5)
nRBC: 0 % (ref 0.0–0.2)

## 2021-07-31 LAB — URINALYSIS, ROUTINE W REFLEX MICROSCOPIC

## 2021-07-31 LAB — COMPREHENSIVE METABOLIC PANEL
ALT: 11 U/L (ref 0–44)
AST: 16 U/L (ref 15–41)
Albumin: 4.4 g/dL (ref 3.5–5.0)
Alkaline Phosphatase: 55 U/L (ref 38–126)
Anion gap: 7 (ref 5–15)
BUN: 14 mg/dL (ref 6–20)
CO2: 28 mmol/L (ref 22–32)
Calcium: 8.8 mg/dL — ABNORMAL LOW (ref 8.9–10.3)
Chloride: 105 mmol/L (ref 98–111)
Creatinine, Ser: 0.8 mg/dL (ref 0.44–1.00)
GFR, Estimated: >60 mL/min (ref >60)
Glucose, Bld: 78 mg/dL (ref 70–99)
Potassium: 3.6 mmol/L (ref 3.5–5.1)
Sodium: 140 mmol/L (ref 135–145)
Total Bilirubin: 1 mg/dL (ref 0.3–1.2)
Total Protein: 7.4 g/dL (ref 6.5–8.1)

## 2021-07-31 LAB — PREGNANCY, URINE: Preg Test, Ur: NEGATIVE

## 2021-07-31 LAB — WET PREP, GENITAL
Clue Cells Wet Prep HPF POC: NONE SEEN
Sperm: NONE SEEN
Trich, Wet Prep: NONE SEEN
Yeast Wet Prep HPF POC: NONE SEEN

## 2021-07-31 MED ORDER — DOXYCYCLINE HYCLATE 100 MG PO CAPS: 100.0000 mg | ORAL_CAPSULE | Freq: Two times a day (BID) | ORAL | 0 refills | Status: DC

## 2021-07-31 MED ORDER — SODIUM CHLORIDE 0.9 % IV BOLUS
1000.0000 mL | Freq: Once | INTRAVENOUS | Status: AC
  Administered 2021-07-31: 1000 mL via INTRAVENOUS

## 2021-07-31 MED ORDER — CEFTRIAXONE SODIUM 1 G IJ SOLR
500.0000 mg | Freq: Once | INTRAMUSCULAR | Status: AC
  Administered 2021-07-31: 500 mg via INTRAMUSCULAR
  Filled 2021-07-31: qty 10

## 2021-07-31 MED ORDER — STERILE WATER FOR INJECTION IJ SOLN
INTRAMUSCULAR | Status: AC
  Administered 2021-07-31: 10 mL
  Filled 2021-07-31: qty 10

## 2021-07-31 MED ORDER — AZITHROMYCIN 250 MG PO TABS
1000.0000 mg | ORAL_TABLET | Freq: Once | ORAL | Status: AC
  Administered 2021-07-31: 1000 mg via ORAL
  Filled 2021-07-31: qty 4

## 2021-07-31 NOTE — Discharge Instructions (Signed)
You were tested for gonorrhea and chlamydia.  However those tests do not come back right away.  You were treated for gonorrhea.  I ordered doxycycline twice a day for a week. If your chlamydia test is negative you can stop taking it.  If you have chlamydia, you will need to notify your sexual partner  See your doctor for follow-up   Take Tylenol or Motrin for pain  Return to ER if you have worse pelvic pain, pelvic discharge, fever, vomiting

## 2021-07-31 NOTE — ED Provider Notes (Signed)
[facility]-EMERGENCY DEPT Provider Note   CSN: [id number] Arrival date & time: 07/31/21  0820     History Chief Complaint  Patient presents with   Abdominal Pain    FRANCELLA BARNETT is a 30 y.o. female hx of obesity, previous PID, here with abdominal pain, vaginal bleeding. Patient states that she finished her menses about a week ago.  She states that since yesterday, she started having spotting.  She also has some mild dysuria as well.  She saw she may have a yeast infection so took Azo.  Patient has a history of PID previously.  She is sexually active and has sex about 2 weeks ago with a man.  She does not know if he has any STDs.  Denies any history of torsion or tubo-ovarian abscess.  Denies any fevers  The history is provided by the patient.      Past Medical History:  Diagnosis Date   Obesity     Patient Active Problem List   Diagnosis Date Noted   Syncope 06/27/2019   Family history of metabolic and nutritional disorder 06/27/2019    Past Surgical History:  Procedure Laterality Date   CHOLECYSTECTOMY     DILATION AND CURETTAGE OF UTERUS     Abortion      OB History     Gravida  1   Para      Term      Preterm      AB  1   Living         SAB      IAB      Ectopic      Multiple      Live Births              Family History  Problem Relation Age of Onset   Thyroid disease Mother    Diabetes Mother    Diabetes Father     Social History   Tobacco Use   Smoking status: Never   Smokeless tobacco: Never  Vaping Use   Vaping Use: Never used  Substance Use Topics   Alcohol use: Yes    Comment: occ   Drug use: No    Home Medications Prior to Admission medications   Medication Sig Start Date End Date Taking? Authorizing Provider  ibuprofen (ADVIL) 200 MG tablet Take 3 tablets (600 mg total) by mouth every 6 (six) hours as needed for fever, headache or mild pain. 10/22/20  Yes Simmons-Robinson, Makiera, MD   polyvinyl alcohol (LIQUIFILM TEARS) 1.4 % ophthalmic solution Place 1 drop into both eyes as needed for dry eyes.   Yes [provider]    Allergies    Patient has no known allergies.  Review of Systems   Review of Systems  Gastrointestinal:  Positive for abdominal pain.  Genitourinary:  Positive for vaginal discharge.  All other systems reviewed and are negative.  Physical Exam Updated Vital Signs BP 120/75 (BP Location: Left Arm)   Pulse 75   Temp (!) 97.5 F (36.4 C) (Oral)   Resp 18   LMP 07/25/2021   SpO2 96%   Physical Exam Vitals and nursing note reviewed.  HENT:     Head: Normocephalic.  Eyes:     Extraocular Movements: Extraocular movements intact.  Cardiovascular:     Rate and Rhythm: Normal rate and regular rhythm.     Heart sounds: Normal heart sounds.  Pulmonary:     Effort: Pulmonary effort is normal.  Breath sounds: Normal breath sounds.  Abdominal:     General: Abdomen is flat.  Skin:    General: Skin is warm.  Neurological:     Mental Status: She is alert.  Psychiatric:        Mood and Affect: Mood normal.        Behavior: Behavior normal.    ED Results / Procedures / Treatments   Labs (all labs ordered are listed, but only abnormal results are displayed) Labs Reviewed  WET PREP, GENITAL - Abnormal; Notable for the following components:      Result Value   WBC, Wet Prep HPF POC RARE (*)    All other components within normal limits  COMPREHENSIVE METABOLIC PANEL - Abnormal; Notable for the following components:   Calcium 8.8 (*)    All other components within normal limits  URINALYSIS, ROUTINE W REFLEX MICROSCOPIC - Abnormal; Notable for the following components:   Color, Urine   (*)    Value: TEST NOT REPORTED DUE TO COLOR INTERFERENCE OF URINE PIGMENT   APPearance   (*)    Value: TEST NOT REPORTED DUE TO COLOR INTERFERENCE OF URINE PIGMENT   Glucose, UA   (*)    Value: TEST NOT REPORTED DUE TO COLOR INTERFERENCE OF URINE  PIGMENT   Hgb urine dipstick   (*)    Value: TEST NOT REPORTED DUE TO COLOR INTERFERENCE OF URINE PIGMENT   Bilirubin Urine   (*)    Value: TEST NOT REPORTED DUE TO COLOR INTERFERENCE OF URINE PIGMENT   Ketones, ur   (*)    Value: TEST NOT REPORTED DUE TO COLOR INTERFERENCE OF URINE PIGMENT   Protein, ur   (*)    Value: TEST NOT REPORTED DUE TO COLOR INTERFERENCE OF URINE PIGMENT   Nitrite   (*)    Value: TEST NOT REPORTED DUE TO COLOR INTERFERENCE OF URINE PIGMENT   Leukocytes,Ua   (*)    Value: TEST NOT REPORTED DUE TO COLOR INTERFERENCE OF URINE PIGMENT   Bacteria, UA RARE (*)    All other components within normal limits  CBC WITH DIFFERENTIAL/PLATELET  PREGNANCY, URINE  I-STAT BETA HCG BLOOD, ED ([facility], [facility], [facility] ONLY)  GC/CHLAMYDIA PROBE AMP ([facility]) NOT AT [facility]    EKG None  Radiology US Transvaginal Non-OB  Result Date: 07/31/2021 CLINICAL DATA:  30 year old female with flank pain.  Pelvic pain. EXAM: TRANSABDOMINAL AND TRANSVAGINAL ULTRASOUND OF PELVIS DOPPLER ULTRASOUND OF OVARIES TECHNIQUE: Both transabdominal and transvaginal ultrasound examinations of the pelvis were performed. Transabdominal technique was performed for global imaging of the pelvis including uterus, ovaries, adnexal regions, and pelvic cul-de-sac. It was necessary to proceed with endovaginal exam following the transabdominal exam to visualize the ovaries. Color and duplex Doppler ultrasound was utilized to evaluate blood flow to the ovaries. COMPARISON:  CT Abdomen and Pelvis 10/22/2020. FINDINGS: Uterus Measurements: 7.4 x 3.3 x 4.8 cm = volume: 60 mL. No fibroids or other mass visualized. Endometrium Thickness: 5 mm.  No focal abnormality visualized. Right ovary Measurements: 3.4 x 2.0 x 2.7 cm= volume: 10 mL. Normal appearance/no adnexal mass. Left ovary Measurements: 3.6 x 1.7 x 2.2 cm= volume: 7 mL. Normal appearance/no adnexal mass. Pulsed Doppler evaluation of both ovaries demonstrates normal  low-resistance arterial and venous waveforms. Other findings No pelvic free fluid. IMPRESSION: Normal pelvis ultrasound with Doppler. No evidence of ovarian torsion. Electronically Signed   By: Odessa Fleming M.D.   On: 07/31/2021 10:45   US Pelvis Complete  Result Date: 07/31/2021 CLINICAL DATA:  30 year old female with flank pain.  Pelvic pain. EXAM: TRANSABDOMINAL AND TRANSVAGINAL ULTRASOUND OF PELVIS DOPPLER ULTRASOUND OF OVARIES TECHNIQUE: Both transabdominal and transvaginal ultrasound examinations of the pelvis were performed. Transabdominal technique was performed for global imaging of the pelvis including uterus, ovaries, adnexal regions, and pelvic cul-de-sac. It was necessary to proceed with endovaginal exam following the transabdominal exam to visualize the ovaries. Color and duplex Doppler ultrasound was utilized to evaluate blood flow to the ovaries. COMPARISON:  CT Abdomen and Pelvis 10/22/2020. FINDINGS: Uterus Measurements: 7.4 x 3.3 x 4.8 cm = volume: 60 mL. No fibroids or other mass visualized. Endometrium Thickness: 5 mm.  No focal abnormality visualized. Right ovary Measurements: 3.4 x 2.0 x 2.7 cm= volume: 10 mL. Normal appearance/no adnexal mass. Left ovary Measurements: 3.6 x 1.7 x 2.2 cm= volume: 7 mL. Normal appearance/no adnexal mass. Pulsed Doppler evaluation of both ovaries demonstrates normal low-resistance arterial and venous waveforms. Other findings No pelvic free fluid. IMPRESSION: Normal pelvis ultrasound with Doppler. No evidence of ovarian torsion. Electronically Signed   By: Odessa Fleming M.D.   On: 07/31/2021 10:45   US Renal  Result Date: 07/31/2021 CLINICAL DATA:  30 year old female with flank pain. EXAM: RENAL / URINARY TRACT ULTRASOUND COMPLETE COMPARISON:  CT Abdomen and Pelvis 10/22/2020. FINDINGS: Right Kidney: Renal measurements: 11.1 x 6.0 x 5.5 cm = volume: 195 mL. No hydronephrosis (image 3). Preserved cortical echotexture and corticomedullary differentiation. No right  renal mass. Left Kidney: Renal measurements: 11.1 x 5.6 x 6.3 cm = volume: 204 mL. Normal cortical echogenicity and corticomedullary differentiation. Small midpole cortical cyst (image 16) appears benign and stable since January. Bladder: Diminutive.  Both ureteral jets detected with Doppler. Other: None. IMPRESSION: Negative renal ultrasound. Electronically Signed   By: Odessa Fleming M.D.   On: 07/31/2021 10:42   [place] Art/Ven Flow Abd Pelv Doppler  Result Date: 07/31/2021 CLINICAL DATA:  30 year old female with flank pain.  Pelvic pain. EXAM: TRANSABDOMINAL AND TRANSVAGINAL ULTRASOUND OF PELVIS DOPPLER ULTRASOUND OF OVARIES TECHNIQUE: Both transabdominal and transvaginal ultrasound examinations of the pelvis were performed. Transabdominal technique was performed for global imaging of the pelvis including uterus, ovaries, adnexal regions, and pelvic cul-de-sac. It was necessary to proceed with endovaginal exam following the transabdominal exam to visualize the ovaries. Color and duplex Doppler ultrasound was utilized to evaluate blood flow to the ovaries. COMPARISON:  CT Abdomen and Pelvis 10/22/2020. FINDINGS: Uterus Measurements: 7.4 x 3.3 x 4.8 cm = volume: 60 mL. No fibroids or other mass visualized. Endometrium Thickness: 5 mm.  No focal abnormality visualized. Right ovary Measurements: 3.4 x 2.0 x 2.7 cm= volume: 10 mL. Normal appearance/no adnexal mass. Left ovary Measurements: 3.6 x 1.7 x 2.2 cm= volume: 7 mL. Normal appearance/no adnexal mass. Pulsed Doppler evaluation of both ovaries demonstrates normal low-resistance arterial and venous waveforms. Other findings No pelvic free fluid. IMPRESSION: Normal pelvis ultrasound with Doppler. No evidence of ovarian torsion. Electronically Signed   By: Odessa Fleming M.D.   On: 07/31/2021 10:45    Procedures Procedures   Medications Ordered in ED Medications  sodium chloride 0.9 % bolus 1,000 mL (1,000 mLs Intravenous New Bag/Given 07/31/21 0950)  cefTRIAXone  (ROCEPHIN) injection 500 mg (500 mg Intramuscular Given 07/31/21 0951)  azithromycin (ZITHROMAX) tablet 1,000 mg (1,000 mg Oral Given 07/31/21 0953)  sterile water (preservative free) injection (10 mLs  Given 07/31/21 1027)    ED Course  I have reviewed the triage vital signs and  the nursing notes.  Pertinent labs & imaging results that were available during my care of the patient were reviewed by me and considered in my medical decision making (see chart for details).    MDM Rules/Calculators/A&P                           SHAMYIA GRANDPRE is a 30 y.o. female here with vaginal bleeding.  Vaginal bleeding and spotting and pain with urination.  Consider STD versus UTI versus yeast infection.  Will do pelvic exam and check basic lab work and pregnancy test  9 AM Pelvic exam showed whitish discharge with CMT and adnexal tenderness. Will get ovarian US and renal US   11:04 AM Labs unremarkable.  Urinalysis showed no obvious infection.  However since she is taking Azo, there is pigments that interfere with interpretation.  No obvious BV or yeast infection on wet prep.  She was given empiric treatment for GC and chlamydia.  Will discharge home with doxycycline until her chlamydia test comes back.  Told her to check on her phone and if her chlamydia is negative she does not need to continue her antibiotics.  Gave strict return precautions.  Final Clinical Impression(s) / ED Diagnoses Final diagnoses:  None    Rx / DC Orders ED Discharge Orders     None        Charlynne Pander, MD 07/31/21 1106

## 2021-07-31 NOTE — ED Triage Notes (Signed)
Pt presents with c/o lower abdominal pain and some light vaginal bleeding. Pt reports her last period was 10/14 so she is not due for another period. Pt also reports that she has some irritation with urination.

## 2021-08-01 LAB — GC/CHLAMYDIA PROBE AMP (~~LOC~~) NOT AT ARMC
Chlamydia: NEGATIVE
Comment: NEGATIVE
Comment: NORMAL
Neisseria Gonorrhea: NEGATIVE

## 2021-12-25 ENCOUNTER — Ambulatory Visit
Admission: EM | Admit: 2021-12-25 | Discharge: 2021-12-25 | Disposition: A | Discharge status: Home / Self Care | Payer: Managed Care, Other (non HMO) | Attending: Emergency Medicine | Admitting: Emergency Medicine

## 2021-12-25 ENCOUNTER — Encounter: Payer: Self-pay | Admitting: Emergency Medicine

## 2021-12-25 DIAGNOSIS — R1084 Generalized abdominal pain: Secondary | ICD-10-CM | POA: Insufficient documentation

## 2021-12-25 DIAGNOSIS — R109 Unspecified abdominal pain: Secondary | ICD-10-CM | POA: Insufficient documentation

## 2021-12-25 DIAGNOSIS — N898 Other specified noninflammatory disorders of vagina: Secondary | ICD-10-CM | POA: Diagnosis present

## 2021-12-25 DIAGNOSIS — R300 Dysuria: Secondary | ICD-10-CM | POA: Insufficient documentation

## 2021-12-25 DIAGNOSIS — N899 Noninflammatory disorder of vagina, unspecified: Secondary | ICD-10-CM

## 2021-12-25 LAB — POCT URINALYSIS DIP (MANUAL ENTRY)
Bilirubin, UA: NEGATIVE
Glucose, UA: NEGATIVE mg/dL
Ketones, POC UA: NEGATIVE mg/dL
Leukocytes, UA: NEGATIVE
Nitrite, UA: POSITIVE — AB
Protein Ur, POC: NEGATIVE mg/dL
Spec Grav, UA: 1.015 (ref 1.010–1.025)
Urobilinogen, UA: 1 E.U./dL
pH, UA: 7 (ref 5.0–8.0)

## 2021-12-25 LAB — POCT URINE PREGNANCY: Preg Test, Ur: NEGATIVE

## 2021-12-25 MED ORDER — CEPHALEXIN 500 MG PO CAPS: 500.0000 mg | ORAL_CAPSULE | Freq: Two times a day (BID) | ORAL | 0 refills | Status: AC

## 2021-12-25 NOTE — ED Triage Notes (Signed)
Pt here with suprapubic pressure and discomfort without discharge or dysuria x 2 weeks. Taking AZO for a week and a half with little relief. Does notice a slightly different vaginal odor. Recalls unprotected sex a moth ago, but no encounters since then.  ?

## 2021-12-25 NOTE — Discharge Instructions (Addendum)
Go to the emergency department if you have abdominal pain, flank pain, or other concerning symptoms.  ? ?Take the antibiotic as directed.  The urine culture is pending.  We will call you if it shows the need to change or discontinue your antibiotic.   ? ?Your vaginal tests are pending.  If your test results are positive, we will call you.  Do not have sexual activity for at least 7 days.   ? ?Follow up with your primary care provider or gynecologist if your symptoms are not improving.   ? ? ? ?

## 2021-12-25 NOTE — ED Provider Notes (Signed)
?[facility] ? ? ? ?CSN: [id number] ?Arrival date & time: 12/25/21  1618 ? ? ?  ? ?History   ?Chief Complaint ?Chief Complaint  ?Patient presents with  ? Dysuria  ? Abdominal Pain  ? ? ?HPI ?Carrie Hanson is a 31 y.o. female.  Patient presents with vaginal irritation x 2 weeks.  She also reports generalized abdominal pain, nausea, low back pain x 2 weeks.  Treatment with Azo, Monistat cream, Monistat suppositories.  No fever, chills, vaginal discharge, pelvic pain, dysuria, vomiting, diarrhea, constipation, or other symptoms.  Last bowel movement was yesterday.  She is sexually active and new sexual partner.  Her medical history includes PID.  ? ?The history is provided by the patient and medical records.  ? ?Past Medical History:  ?Diagnosis Date  ? Obesity   ? ? ?Patient Active Problem List  ? Diagnosis Date Noted  ? Syncope 06/27/2019  ? Family history of metabolic and nutritional disorder 06/27/2019  ? ? ?Past Surgical History:  ?Procedure Laterality Date  ? CHOLECYSTECTOMY    ? DILATION AND CURETTAGE OF UTERUS    ? Abortion   ? ? ?OB History   ? ? Gravida  ?1  ? Para  ?   ? Term  ?   ? Preterm  ?   ? AB  ?1  ? Living  ?   ?  ? ? SAB  ?   ? IAB  ?   ? Ectopic  ?   ? Multiple  ?   ? Live Births  ?   ?   ?  ?  ? ? ? ?Home Medications   ? ?Prior to Admission medications   ?Medication Sig Start Date End Date Taking? Authorizing Provider  ?cephALEXin (KEFLEX) 500 MG capsule Take 1 capsule (500 mg total) by mouth 2 (two) times daily for 5 days. 12/25/21 12/30/21 Yes Mickie Bailate, Altair Stanko H, NP  ?polyvinyl alcohol (LIQUIFILM TEARS) 1.4 % ophthalmic solution Place 1 drop into both eyes as needed for dry eyes.    [provider]  ? ? ?Family History ?Family History  ?Problem Relation Age of Onset  ? Thyroid disease Mother   ? Diabetes Mother   ? Diabetes Father   ? ? ?Social History ?Social History  ? ?Tobacco Use  ? Smoking status: Never  ? Smokeless tobacco: Never  ?Vaping Use  ? Vaping Use: Never used   ?Substance Use Topics  ? Alcohol use: Yes  ?  Comment: occ  ? Drug use: No  ? ? ? ?Allergies   ?Patient has no known allergies. ? ? ?Review of Systems ?Review of Systems  ?Constitutional:  Negative for chills and fever.  ?Gastrointestinal:  Positive for abdominal pain and nausea. Negative for constipation, diarrhea and vomiting.  ?Genitourinary:  Negative for dysuria, flank pain, hematuria, pelvic pain and vaginal discharge.  ?Musculoskeletal:  Positive for back pain.  ?All other systems reviewed and are negative. ? ? ?Physical Exam ?Triage Vital Signs ?ED Triage Vitals  ?Enc Vitals Group  ?   BP   ?   Pulse   ?   Resp   ?   Temp   ?   Temp src   ?   SpO2   ?   Weight   ?   Height   ?   Head Circumference   ?   Peak Flow   ?   Pain Score   ?   Pain Loc   ?  Pain Edu?   ?   Excl. in GC?   ? ?No data found. ? ?Updated Vital Signs ?BP 122/76   Pulse 83   Temp 98.2 ?F (36.8 ?C)   Resp 18   LMP 12/10/2021   SpO2 98%  ? ?Visual Acuity ?Right Eye Distance:   ?Left Eye Distance:   ?Bilateral Distance:   ? ?Right Eye Near:   ?Left Eye Near:    ?Bilateral Near:    ? ?Physical Exam ?Vitals and nursing note reviewed.  ?Constitutional:   ?   General: She is not in acute distress. ?   Appearance: She is well-developed. She is obese. She is not ill-appearing.  ?HENT:  ?   Mouth/Throat:  ?   Mouth: Mucous membranes are moist.  ?Cardiovascular:  ?   Rate and Rhythm: Normal rate and regular rhythm.  ?   Heart sounds: Normal heart sounds.  ?Pulmonary:  ?   Effort: Pulmonary effort is normal. No respiratory distress.  ?   Breath sounds: Normal breath sounds.  ?Abdominal:  ?   General: Bowel sounds are normal.  ?   Palpations: Abdomen is soft.  ?   Tenderness: There is generalized abdominal tenderness. There is left CVA tenderness. There is no right CVA tenderness, guarding or rebound.  ?Genitourinary: ?   Comments: Patient declines pelvic exam. ?Musculoskeletal:  ?   Cervical back: Neck supple.  ?Skin: ?   General: Skin is warm  and dry.  ?Neurological:  ?   Mental Status: She is alert.  ?Psychiatric:     ?   Mood and Affect: Mood normal.     ?   Behavior: Behavior normal.  ? ? ? ?UC Treatments / Results  ?Labs ?(all labs ordered are listed, but only abnormal results are displayed) ?Labs Reviewed  ?POCT URINALYSIS DIP (MANUAL ENTRY) - Abnormal; Notable for the following components:  ?    Result Value  ? Color, UA orange (*)   ? Blood, UA trace-intact (*)   ? Nitrite, UA Positive (*)   ? All other components within normal limits  ?POCT URINE PREGNANCY - Normal  ?URINE CULTURE  ?CERVICOVAGINAL ANCILLARY ONLY  ? ? ?EKG ? ? ?Radiology ?No results found. ? ?Procedures ?Procedures (including critical care time) ? ?Medications Ordered in UC ?Medications - No data to display ? ?Initial Impression / Assessment and Plan / UC Course  ?I have reviewed the triage vital signs and the nursing notes. ? ?Pertinent labs & imaging results that were available during my care of the patient were reviewed by me and considered in my medical decision making (see chart for details). ? ?  ?Dysuria, generalized abdominal pain, left flank pain, vaginal irritation.  Patient has generalized abdominal pain and left flank pain.  She is afebrile and VSS.  She declines transfer to the ED at this time.  She also declines pelvic exam at this time.  Patient is here on her break from work and states she has to go back to work and does not have time and cannot go to the ED at this time.  Treating with Keflex. Urine culture pending. Discussed with patient that we will call her if the urine culture shows the need to change or discontinue the antibiotic. Patient obtained vaginal self swab for testing.  Discussed that we will call if test results are positive  Instructed patient to abstain from sexual activity for at least 7 days.  Instructed her to follow-up with her PCP or gynecologist  if her symptoms are not improving.  Strict ED precautions discussed at length.  Patient agrees to  plan of care.  ? ?Final Clinical Impressions(s) / UC Diagnoses  ? ?Final diagnoses:  ?Dysuria  ?Generalized abdominal pain  ?Left flank pain  ?Vaginal irritation  ? ? ? ?Discharge Instructions   ? ?  ?Go to the emergency department if you have abdominal pain, flank pain, or other concerning symptoms.  ? ?Take the antibiotic as directed.  The urine culture is pending.  We will call you if it shows the need to change or discontinue your antibiotic.   ? ?Your vaginal tests are pending.  If your test results are positive, we will call you.  Do not have sexual activity for at least 7 days.   ? ?Follow up with your primary care provider or gynecologist if your symptoms are not improving.   ? ? ? ? ? ? ? ?ED Prescriptions   ? ? Medication Sig Dispense Auth. Provider  ? cephALEXin (KEFLEX) 500 MG capsule Take 1 capsule (500 mg total) by mouth 2 (two) times daily for 5 days. 10 capsule Mickie Bail, NP  ? ?  ? ?PDMP not reviewed this encounter. ?  ?Mickie Bail, NP ?12/25/21 1723 ? ?

## 2021-12-27 LAB — URINE CULTURE: Culture: <10000 — AB

## 2021-12-29 LAB — CERVICOVAGINAL ANCILLARY ONLY
Bacterial Vaginitis (gardnerella): NEGATIVE
Candida Glabrata: NEGATIVE
Candida Vaginitis: NEGATIVE
Chlamydia: NEGATIVE
Comment: NEGATIVE
Comment: NEGATIVE
Comment: NEGATIVE
Comment: NEGATIVE
Comment: NEGATIVE
Comment: NORMAL
Neisseria Gonorrhea: NEGATIVE
Trichomonas: NEGATIVE

## 2022-03-07 IMAGING — US US RENAL
1 series · 15 of 25 positions shown · non-contrast
Comparison: CT Abdomen and Pelvis 10/22/2020.

CLINICAL DATA: 29-year-old female with flank pain.

EXAM:
RENAL / URINARY TRACT ULTRASOUND COMPLETE

[Series 1: us renal mc & wl · 15 of 31 slices shown]
[im 1/31]
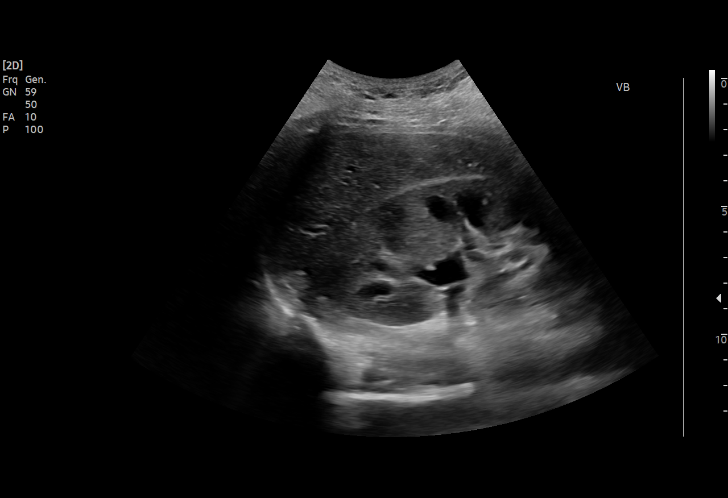
[im 3/31]
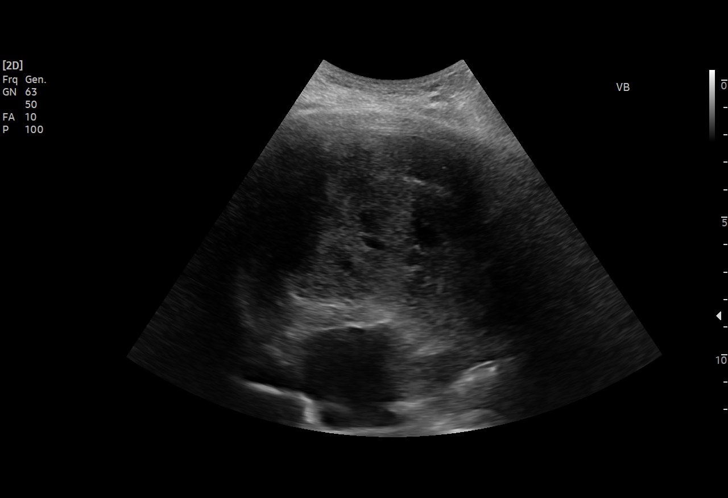
[im 6/31]
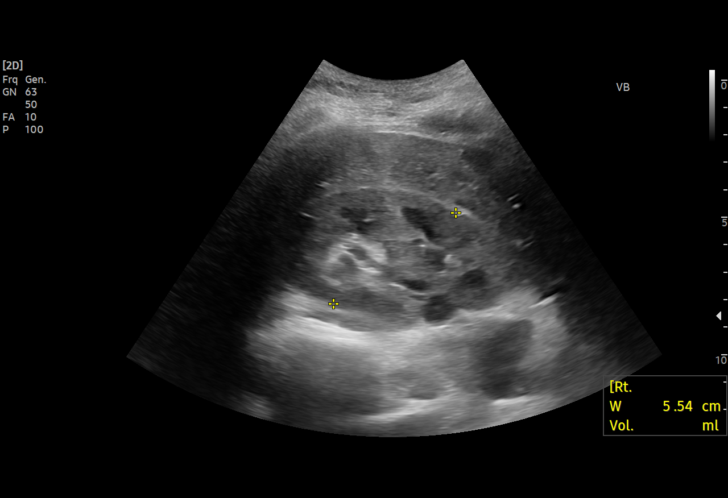
[im 7/31]
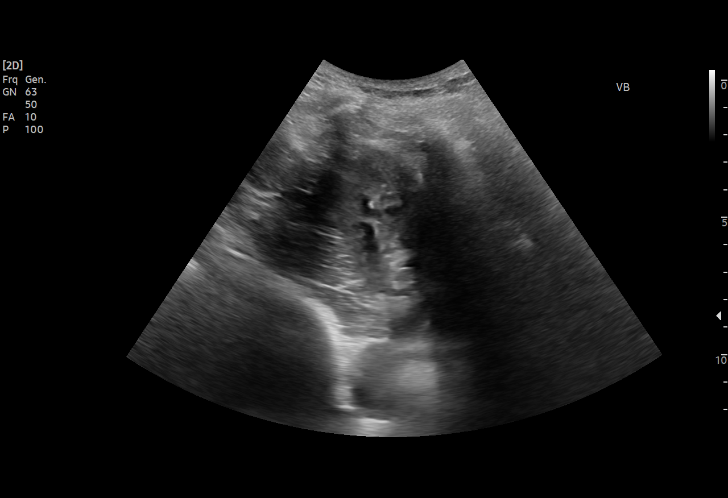
[im 9/31]
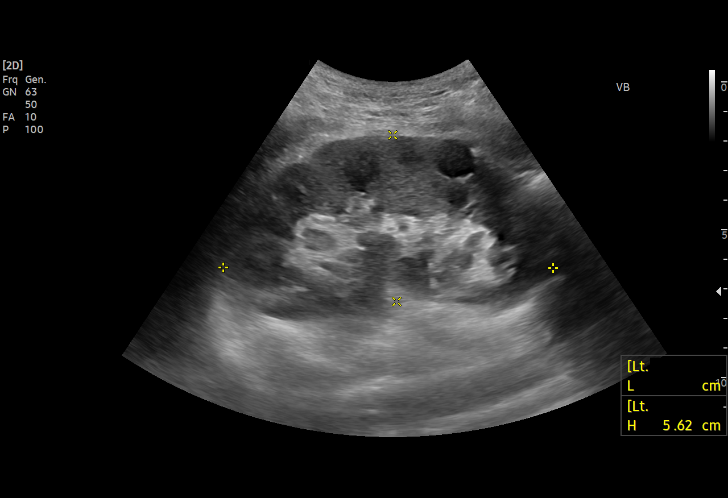
[im 12/31]
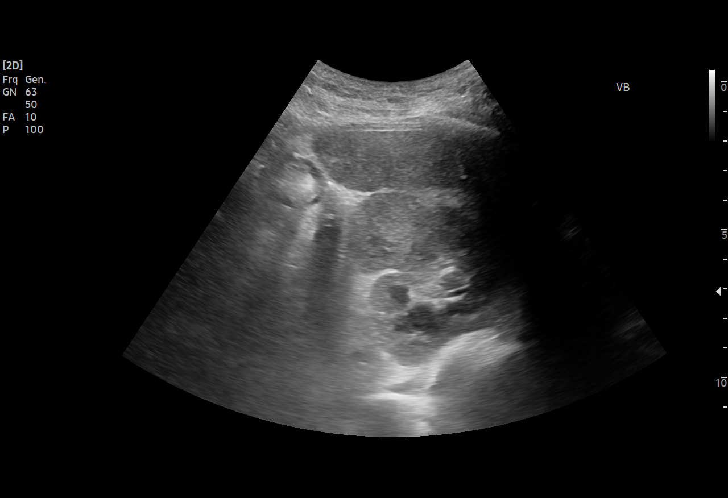
[im 13/31]
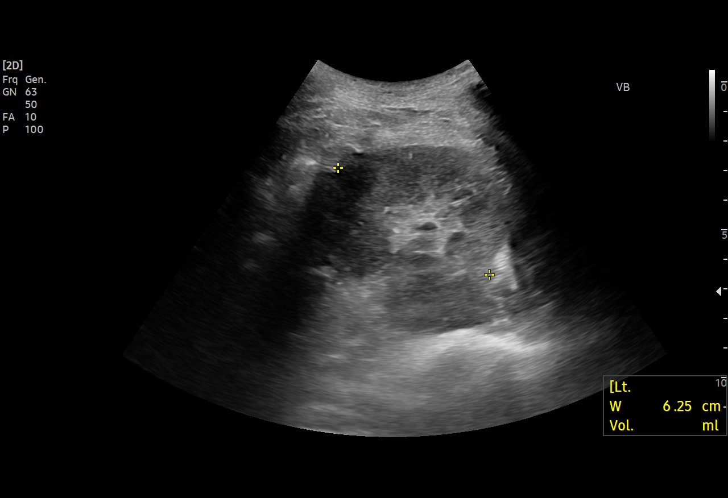
[im 16/31]
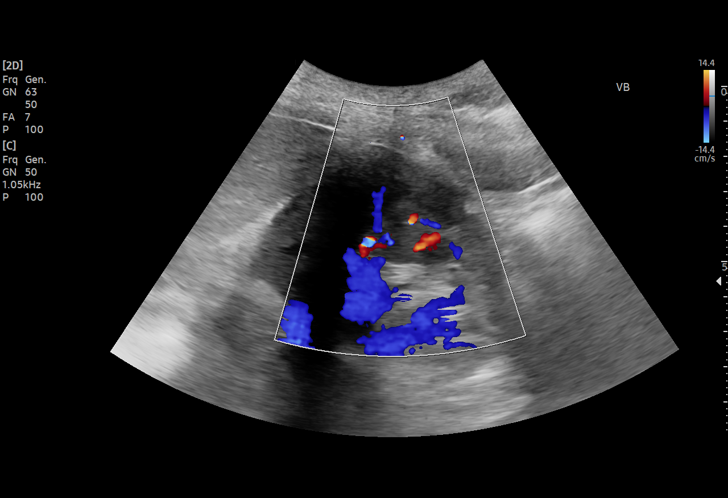
[im 18/31]
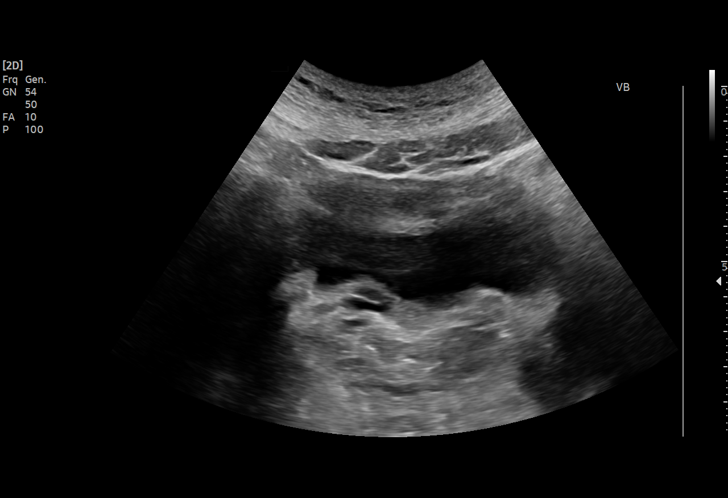
[im 19/31]
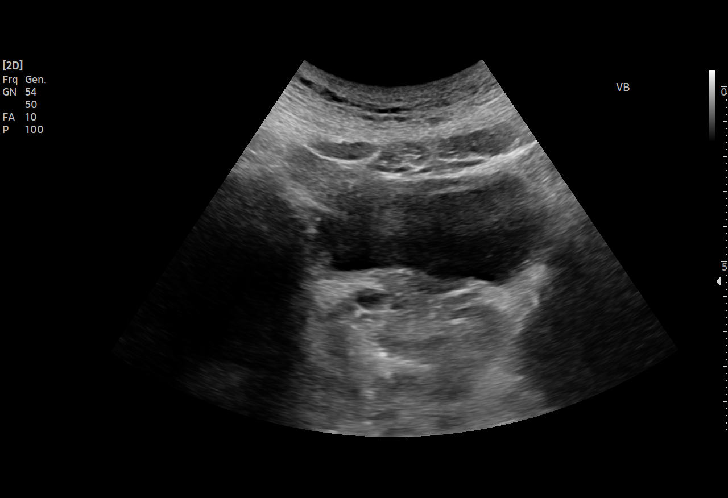
[im 22/31]
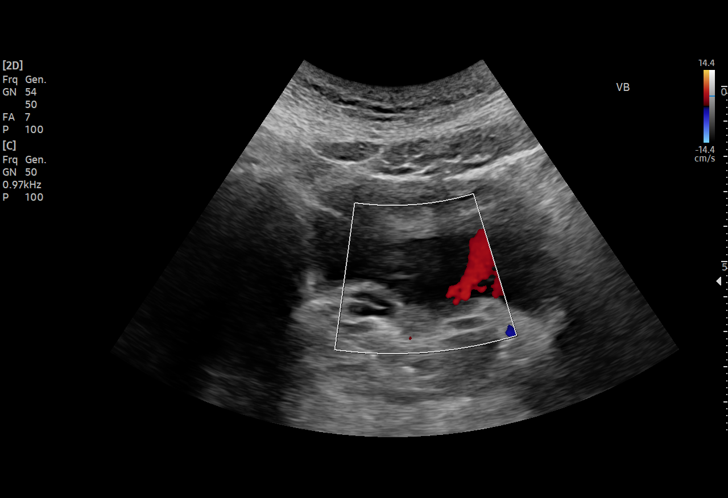
[im 24/31]
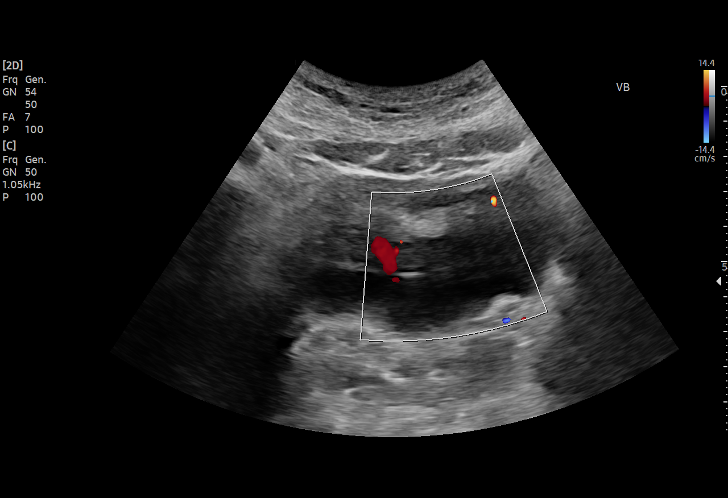
[im 26/31]
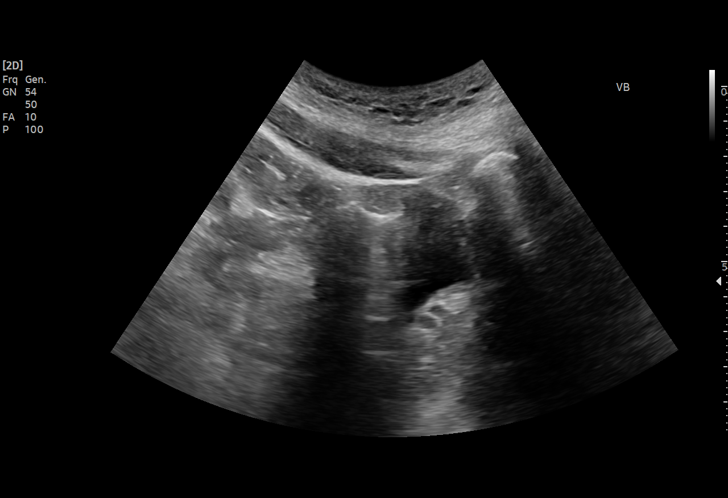
[im 28/31]
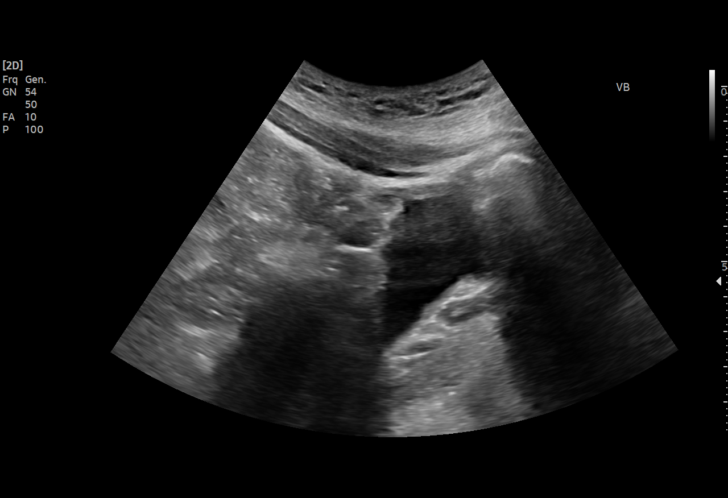
[im 31/31]
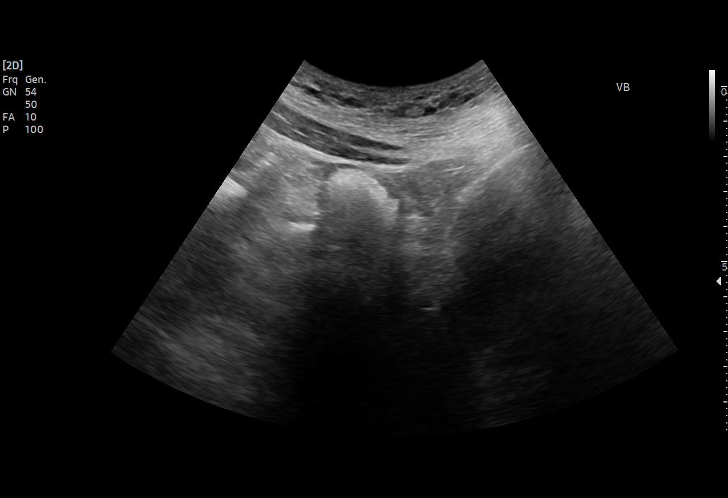

[15 of 25 positions shown; findings below may reference images not displayed]

FINDINGS: Right Kidney:

Renal measurements: 11.1 x 6.0 x 5.5 cm = volume: 195 mL. No
hydronephrosis (image 3). Preserved cortical echotexture and
corticomedullary differentiation. No right renal mass.

Left Kidney:

Renal measurements: 11.1 x 5.6 x 6.3 cm = volume: 204 mL. Normal
cortical echogenicity and corticomedullary differentiation. Small
midpole cortical cyst (image 16) appears benign and stable since
[REDACTED].

Bladder:

Diminutive.  Both ureteral jets detected with Doppler.

Other:

None.
IMPRESSION: Negative renal ultrasound.

## 2022-04-17 ENCOUNTER — Ambulatory Visit (INDEPENDENT_AMBULATORY_CARE_PROVIDER_SITE_OTHER): Payer: Managed Care, Other (non HMO) | Admitting: Radiology

## 2022-04-17 ENCOUNTER — Encounter: Payer: Self-pay | Admitting: Radiology

## 2022-04-17 ENCOUNTER — Other Ambulatory Visit (HOSPITAL_COMMUNITY)
Admission: RE | Admit: 2022-04-17 | Discharge: 2022-04-17 | Disposition: A | Discharge status: Home / Self Care | Payer: Managed Care, Other (non HMO) | Source: Ambulatory Visit | Attending: Obstetrics and Gynecology | Admitting: Obstetrics and Gynecology

## 2022-04-17 ENCOUNTER — Encounter: Payer: Managed Care, Other (non HMO) | Admitting: Obstetrics and Gynecology

## 2022-04-17 VITALS — BP 122/70 | Ht 65.5 in | Wt 235.0 lb

## 2022-04-17 DIAGNOSIS — Z3009 Encounter for other general counseling and advice on contraception: Secondary | ICD-10-CM | POA: Diagnosis not present

## 2022-04-17 DIAGNOSIS — N760 Acute vaginitis: Secondary | ICD-10-CM

## 2022-04-17 DIAGNOSIS — Z113 Encounter for screening for infections with a predominantly sexual mode of transmission: Secondary | ICD-10-CM | POA: Diagnosis present

## 2022-04-17 DIAGNOSIS — Z01419 Encounter for gynecological examination (general) (routine) without abnormal findings: Secondary | ICD-10-CM | POA: Diagnosis present

## 2022-04-17 LAB — WET PREP FOR TRICH, YEAST, CLUE

## 2022-04-17 MED ORDER — PHEXXI 1.8-1-0.4 % VA GEL: 5.0000 g | Freq: Every day | VAGINAL | 11 refills | Status: DC

## 2022-04-17 NOTE — Progress Notes (Signed)
Carrie Hanson April 17, 1991 [id number]   History:  31 y.o. G1P0 presents for annual exam. Complains of vaginal irritation, no discharge (x's 1 week)--recently sexually active with ex-partner. Would like STI screen, Interested in nonhormonal birth control  Gynecologic History Patient's last menstrual period was 04/06/2022 (exact date). Period Cycle (Days): 28 Period Duration (Days): 5 Period Pattern: Regular Menstrual Flow: Moderate (moderate to heavy flow) Dysmenorrhea: (!) Severe Dysmenorrhea Symptoms: Cramping Contraception/Family planning: coitus interruptus Sexually active: yes Last Pap: 2017. Results were: normal   Obstetric History OB History  Gravida Para Term Preterm AB Living  1       1    SAB IAB Ectopic Multiple Live Births               # Outcome Date GA Lbr Len/2nd Weight Sex Delivery Anes PTL Lv  1 AB              The following portions of the patient's history were reviewed and updated as appropriate: allergies, current medications, past family history, past medical history, past social history, past surgical history, and problem list.  Review of Systems Pertinent items noted in HPI and remainder of comprehensive ROS otherwise negative.   Past medical history, past surgical history, family history and social history were all reviewed and documented in the EPIC chart.   Exam:  Vitals:   04/17/22 0950  BP: 122/70  Weight: 235 lb (106.6 kg)  Height: 5' 5.5" (1.664 m)   Body mass index is 38.51 kg/m.  General appearance:  Normal Thyroid:  Symmetrical, normal in size, without palpable masses or nodularity. Respiratory  Auscultation:  Clear without wheezing or rhonchi Cardiovascular  Auscultation:  Regular rate, without rubs, murmurs or gallops  Edema/varicosities:  Not grossly evident Abdominal  Soft,nontender, without masses, guarding or rebound.  Liver/spleen:  No organomegaly noted  Hernia:  None appreciated  Skin  Inspection:  Grossly  normal Breasts: Examined lying and sitting.   Right: Without masses, retractions, nipple discharge or axillary adenopathy.   Left: Without masses, retractions, nipple discharge or axillary adenopathy. Genitourinary   Inguinal/mons:  Normal without inguinal adenopathy  External genitalia:  Normal appearing vulva with no masses, tenderness, or lesions  BUS/Urethra/Skene's glands:  Normal without masses or exudate  Vagina:  Normal appearing with normal color, thick white d/c present  Cervix:  Normal appearing without discharge or lesions  Uterus:  Normal in size, shape and contour.  Mobile, nontender  Adnexa/parametria:     Rt: Normal in size, without masses or tenderness.   Lt: Normal in size, without masses or tenderness.  Anus and perineum: Normal  Microscopic wet-mount exam shows negative for pathogens, normal epithelial cells.   Patient informed chaperone available to be present for breast and pelvic exam. Patient has requested no chaperone to be present. Patient has been advised what will be completed during breast and pelvic exam.   Assessment/Plan:   1. Well woman exam with routine gynecological exam  - Cytology - PAP( [place])  2. Screening for STDs (sexually transmitted diseases)  - Cytology - PAP( [place])  3. Acute vaginitis  - WET PREP FOR TRICH, YEAST, CLUE  4. Encounter for counseling regarding initiation of other contraceptive measure  - Lactic Ac-Citric Ac-Pot Bitart (PHEXXI) 1.8-1-0.4 % GEL; Place 5 g vaginally daily. As needed  Dispense: 120 g; Refill: 11     Discussed SBE, pap and STI screening as directed/appropriate. Recommend of exercise weekly, including weight bearing exercise. Encouraged the  use of seatbelts and sunscreen. Return in 1 year for annual or as needed.   Arlie Solomons B WHNP-BC 10:10 AM 04/17/2022

## 2022-04-22 ENCOUNTER — Telehealth: Payer: Self-pay | Admitting: *Deleted

## 2022-04-22 MED ORDER — FLUCONAZOLE 150 MG PO TABS: 150.0000 mg | ORAL_TABLET | Freq: Once | ORAL | 0 refills | Status: AC

## 2022-04-22 NOTE — Telephone Encounter (Signed)
Patient informed. Rx sent 

## 2022-04-22 NOTE — Telephone Encounter (Signed)
Patient saw lab results for wet prep via my chart. Reports she still has irritation, reports she finish last Monistat on Frida. Please  advise

## 2022-04-22 NOTE — Telephone Encounter (Signed)
May try diflucan 150mg  po once. If no improvement schedule visit for repeat wet mount

## 2022-04-23 ENCOUNTER — Other Ambulatory Visit: Payer: Self-pay

## 2022-04-23 DIAGNOSIS — R87610 Atypical squamous cells of undetermined significance on cytologic smear of cervix (ASC-US): Secondary | ICD-10-CM

## 2022-04-23 LAB — CYTOLOGY - PAP
Chlamydia: NEGATIVE
Comment: NEGATIVE
Comment: NEGATIVE
Comment: NEGATIVE
Comment: NEGATIVE
Comment: NEGATIVE
Comment: NORMAL
Diagnosis: UNDETERMINED — AB
HPV 16: POSITIVE — AB
HPV 18 / 45: NEGATIVE
High risk HPV: POSITIVE — AB
Neisseria Gonorrhea: NEGATIVE
Trichomonas: NEGATIVE

## 2022-05-07 NOTE — Progress Notes (Deleted)
  Subjective:     Patient ID: Carrie Hanson, female   DOB: 02-17-1991, 31 y.o.   MRN: [id number]  HPI Patient here today for colposcopy with pap 04-17-22 ASCUS:Pos HR HPV;Pos.genotype 16, Neg 18/45   Review of Systems LMP: Contraception: Withdrawal UPT:     Objective:   Physical Exam     Assessment:     ***    Plan:     ***

## 2022-05-15 ENCOUNTER — Ambulatory Visit: Payer: Managed Care, Other (non HMO) | Admitting: Obstetrics and Gynecology

## 2022-06-22 ENCOUNTER — Other Ambulatory Visit: Payer: Self-pay

## 2022-06-22 ENCOUNTER — Encounter: Payer: Self-pay | Admitting: Emergency Medicine

## 2022-06-22 ENCOUNTER — Ambulatory Visit
Admission: EM | Admit: 2022-06-22 | Discharge: 2022-06-22 | Disposition: A | Discharge status: Home / Self Care | Payer: Managed Care, Other (non HMO) | Attending: Urgent Care | Admitting: Urgent Care

## 2022-06-22 DIAGNOSIS — J020 Streptococcal pharyngitis: Secondary | ICD-10-CM | POA: Diagnosis not present

## 2022-06-22 LAB — POCT RAPID STREP A (OFFICE): Rapid Strep A Screen: NEGATIVE

## 2022-06-22 MED ORDER — AMOXICILLIN 500 MG PO CAPS: 500.0000 mg | ORAL_CAPSULE | Freq: Two times a day (BID) | ORAL | 0 refills | Status: AC

## 2022-06-22 NOTE — ED Triage Notes (Signed)
Patient c/o sore throat x 2-3 days.   Patient endorses " feeling hot" but no recorded temperature from home.   Patient endorses painful swallowing.   Patient has taken Tylenol and Ibuprofen with no relief of symptoms

## 2022-06-22 NOTE — ED Provider Notes (Signed)
[facility]    CSN: [id number] Arrival date & time: 06/22/22  1043      History   Chief Complaint Chief Complaint  Patient presents with   Sore Throat    HPI Carrie Hanson is a 31 y.o. female.    Sore Throat    Past Medical History:  Diagnosis Date   Obesity     Patient Active Problem List   Diagnosis Date Noted   Syncope 06/27/2019   Family history of metabolic and nutritional disorder 06/27/2019    Past Surgical History:  Procedure Laterality Date   CHOLECYSTECTOMY     DILATION AND CURETTAGE OF UTERUS     Abortion     OB History     Gravida  1   Para      Term      Preterm      AB  1   Living         SAB      IAB      Ectopic      Multiple      Live Births               Home Medications    Prior to Admission medications   Medication Sig Start Date End Date Taking? Authorizing Provider  amoxicillin (AMOXIL) 500 MG capsule Take 1 capsule (500 mg total) by mouth 2 (two) times daily for 10 days. 06/22/22 07/02/22 Yes Ailynn Gow, Jeannett Senior, FNP  Lactic Ac-Citric Ac-Pot Bitart (PHEXXI) 1.8-1-0.4 % GEL Place 5 g vaginally daily. As needed 04/17/22   Chrzanowski, Lamona Curl, NP    Family History Family History  Problem Relation Age of Onset   Thyroid disease Mother    Diabetes Mother    Diabetes Father     Social History Social History   Tobacco Use   Smoking status: Never   Smokeless tobacco: Never  Vaping Use   Vaping Use: Never used  Substance Use Topics   Alcohol use: Yes    Comment: occ   Drug use: No     Allergies   Patient has no known allergies.   Review of Systems Review of Systems   Physical Exam Triage Vital Signs ED Triage Vitals  Enc Vitals Group     BP 06/22/22 1105 113/77     Pulse Rate 06/22/22 1105 89     Resp 06/22/22 1105 12     Temp 06/22/22 1105 99.4 F (37.4 C)     Temp Source 06/22/22 1105 Oral     SpO2 06/22/22 1105 98 %     Weight --      Height --      Head  Circumference --      Peak Flow --      Pain Score 06/22/22 1113 8     Pain Loc --      Pain Edu? --      Excl. in [place]? --    No data found.  Updated Vital Signs BP 113/77 (BP Location: Left Arm)   Pulse 89   Temp 99.4 F (37.4 C) (Oral)   Resp 12   LMP 06/02/2022 (Exact Date)   SpO2 98%   Visual Acuity Right Eye Distance:   Left Eye Distance:   Bilateral Distance:    Right Eye Near:   Left Eye Near:    Bilateral Near:     Physical Exam Vitals reviewed.  Constitutional:      Appearance: She is well-developed.  HENT:  Mouth/Throat:     Mouth: Mucous membranes are moist.     Pharynx: Posterior oropharyngeal erythema present.     Tonsils: Tonsillar exudate present. 2+ on the right. 1+ on the left.     Comments: White exudate is present on the left tonsil. Skin:    General: Skin is warm and dry.  Neurological:     General: No focal deficit present.     Mental Status: She is alert and oriented to person, place, and time.  Psychiatric:        Mood and Affect: Mood normal.        Behavior: Behavior normal.      UC Treatments / Results  Labs (all labs ordered are listed, but only abnormal results are displayed) Labs Reviewed  POCT RAPID STREP A (OFFICE)    EKG   Radiology No results found.  Procedures Procedures (including critical care time)  Medications Ordered in UC Medications - No data to display  Initial Impression / Assessment and Plan / UC Course  I have reviewed the triage vital signs and the nursing notes.  Pertinent labs & imaging results that were available during my care of the patient were reviewed by me and considered in my medical decision making (see chart for details).    Patient with acute and worsening sore throat, difficulty swallowing and preventing her from eating and drinking.  Ibuprofen seems to be effective until it wears off.  Gargling with salt water also seems to be effective.  Rapid strep is negative but, on exam, white  exudate is present on the left tonsil.  I am treating for strep pharyngitis.  Will not bother with culture.  Continue to use ibuprofen for pain relief and gargle with salt water frequently during the day.  Follow-up with primary care provider or here if symptoms do not resolve.  Final Clinical Impressions(s) / UC Diagnoses   Final diagnoses:  Strep pharyngitis     Discharge Instructions      Follow up with your primary care provider if your symptoms are not improving.        ED Prescriptions     Medication Sig Dispense Auth. Provider   amoxicillin (AMOXIL) 500 MG capsule Take 1 capsule (500 mg total) by mouth 2 (two) times daily for 10 days. 20 capsule Alie Hardgrove, Jeannett Senior, FNP      PDMP not reviewed this encounter.   Charma Igo, [place] 06/22/22 1129

## 2022-06-22 NOTE — Discharge Instructions (Signed)
Follow up with your primary care provider if your symptoms are not improving.     

## 2022-08-05 ENCOUNTER — Telehealth: Payer: Self-pay | Admitting: *Deleted

## 2022-08-05 DIAGNOSIS — R87610 Atypical squamous cells of undetermined significance on cytologic smear of cervix (ASC-US): Secondary | ICD-10-CM

## 2022-08-05 NOTE — Telephone Encounter (Signed)
04/17/22 PAP ASCUS HPV 16 positive  Colpo scheduled 05/15/22, cancelled by patient.   Spoke with patient. LMP 07/31/22, no contraceptive. Has not been sexually active since menses started. Brief explanation of Colpo provided, patient agreeable to schedule. Scheduled for 08/17/22 at 1030. Advised to take Motrin 800 mg with food and water one hour before procedure. Message to business office to review benefits. New order placed. Patient verbalizes understanding and is agreeable.   Routing to provider for final review. Patient is agreeable to disposition. Will close encounter.

## 2022-08-17 ENCOUNTER — Ambulatory Visit: Payer: Managed Care, Other (non HMO) | Admitting: Obstetrics and Gynecology

## 2022-08-17 DIAGNOSIS — Z0289 Encounter for other administrative examinations: Secondary | ICD-10-CM

## 2022-08-17 NOTE — Progress Notes (Deleted)
GYNECOLOGY  VISIT   HPI: 31 y.o.   Single  African American  female   [id number] with No LMP recorded.   here for colposcopy.    GYNECOLOGIC HISTORY: No LMP recorded. Contraception:  *** Menopausal hormone therapy:  N/A Last mammogram:  N/A Last pap smear:   04/17/22, ASCUS positive HPV        OB History     Gravida  1   Para      Term      Preterm      AB  1   Living         SAB      IAB      Ectopic      Multiple      Live Births                 Patient Active Problem List   Diagnosis Date Noted   Syncope 06/27/2019   Family history of metabolic and nutritional disorder 06/27/2019    Past Medical History:  Diagnosis Date   Obesity     Past Surgical History:  Procedure Laterality Date   CHOLECYSTECTOMY     DILATION AND CURETTAGE OF UTERUS     Abortion     Current Outpatient Medications  Medication Sig Dispense Refill   Lactic Ac-Citric Ac-Pot Bitart (PHEXXI) 1.8-1-0.4 % GEL Place 5 g vaginally daily. As needed 120 g 11   No current facility-administered medications for this visit.     ALLERGIES: Patient has no known allergies.  Family History  Problem Relation Age of Onset   Thyroid disease Mother    Diabetes Mother    Diabetes Father     Social History   Socioeconomic History   Marital status: Single    Spouse name: Not on file   Number of children: Not on file   Years of education: Not on file   Highest education level: Not on file  Occupational History   Not on file  Tobacco Use   Smoking status: Never   Smokeless tobacco: Never  Vaping Use   Vaping Use: Never used  Substance and Sexual Activity   Alcohol use: Yes    Comment: occ   Drug use: No   Sexual activity: Yes    Partners: Male    Birth control/protection: None  Other Topics Concern   Not on file  Social History Narrative   Not on file   Social Determinants of Health   Financial Resource Strain: Not on file  Food Insecurity: Not on file  Transportation  Needs: Not on file  Physical Activity: Not on file  Stress: Not on file  Social Connections: Not on file  Intimate Partner Violence: Not on file    Review of Systems  PHYSICAL EXAMINATION:    There were no vitals taken for this visit.    General appearance: alert, cooperative and appears stated age Head: Normocephalic, without obvious abnormality, atraumatic Neck: no adenopathy, supple, symmetrical, trachea midline and thyroid normal to inspection and palpation Lungs: clear to auscultation bilaterally Breasts: normal appearance, no masses or tenderness, No nipple retraction or dimpling, No nipple discharge or bleeding, No axillary or supraclavicular adenopathy Heart: regular rate and rhythm Abdomen: soft, non-tender, no masses,  no organomegaly Extremities: extremities normal, atraumatic, no cyanosis or edema Skin: Skin color, texture, turgor normal. No rashes or lesions Lymph nodes: Cervical, supraclavicular, and axillary nodes normal. No abnormal inguinal nodes palpated Neurologic: Grossly normal  Pelvic: External genitalia:  no  lesions              Urethra:  normal appearing urethra with no masses, tenderness or lesions              Bartholins and Skenes: normal                 Vagina: normal appearing vagina with normal color and discharge, no lesions              Cervix: no lesions                Bimanual Exam:  Uterus:  normal size, contour, position, consistency, mobility, non-tender              Adnexa: no mass, fullness, tenderness              Rectal exam: {yes no:314532}.  Confirms.              Anus:  normal sphincter tone, no lesions  Chaperone was present for exam:  ***  ASSESSMENT     PLAN     An After Visit Summary was printed and given to the patient.  ______ minutes face to face time of which over 50% was spent in counseling.

## 2022-08-31 ENCOUNTER — Ambulatory Visit: Payer: Managed Care, Other (non HMO) | Admitting: Obstetrics and Gynecology

## 2022-08-31 DIAGNOSIS — Z0289 Encounter for other administrative examinations: Secondary | ICD-10-CM

## 2022-09-07 ENCOUNTER — Telehealth: Payer: Self-pay | Admitting: Obstetrics and Gynecology

## 2022-09-07 DIAGNOSIS — R87610 Atypical squamous cells of undetermined significance on cytologic smear of cervix (ASC-US): Secondary | ICD-10-CM

## 2022-09-07 NOTE — Telephone Encounter (Signed)
Patient cancelled her August 4 colposcopy appointment and no show for same appointment on November 6 and 20. Patient has not rescheduled colposcopy appointment.

## 2022-09-21 NOTE — Addendum Note (Signed)
Addended by: Leda Min on: 09/21/2022 01:23 PM   Modules accepted: Orders

## 2022-09-21 NOTE — Telephone Encounter (Signed)
Spoke with patient.   Reviewed 04/17/22 PAP results per Clearnce Hasten, NP.  ASCUS, positive HPV, positive HPV 16  LMP 08/30/22. Last sexually active 07/2022.   Colpo scheduled for 10/23/22 at 11am with Dr. Oscar La. Order placed. Advised to take Motrin 800 mg with food and water one hour before procedure.  Message to business office regarding benefits.   Routing to provider for final review. Patient is agreeable to disposition. Will close encounter.  Cc: Dr. Oscar La

## 2022-10-23 ENCOUNTER — Ambulatory Visit: Payer: Managed Care, Other (non HMO) | Admitting: Obstetrics and Gynecology

## 2022-10-23 NOTE — Progress Notes (Deleted)
GYNECOLOGY  VISIT   HPI: 32 y.o.   Single Black or African American Not Hispanic or Latino  female   G1P0010 with No LMP recorded.   here for evaluation of an abnormal pap. Pap from 04/17/22 returned as ASCUS, +HPV, +HPV 16  GYNECOLOGIC HISTORY: No LMP recorded. Contraception:*** Menopausal hormone therapy: ***        OB History     Gravida  1   Para      Term      Preterm      AB  1   Living         SAB      IAB      Ectopic      Multiple      Live Births                 Patient Active Problem List   Diagnosis Date Noted   Syncope 06/27/2019   Family history of metabolic and nutritional disorder 06/27/2019    Past Medical History:  Diagnosis Date   Obesity     Past Surgical History:  Procedure Laterality Date   CHOLECYSTECTOMY     DILATION AND CURETTAGE OF UTERUS     Abortion     Current Outpatient Medications  Medication Sig Dispense Refill   Lactic Ac-Citric Ac-Pot Bitart (PHEXXI) 1.8-1-0.4 % GEL Place 5 g vaginally daily. As needed 120 g 11   No current facility-administered medications for this visit.     ALLERGIES: Patient has no known allergies.  Family History  Problem Relation Age of Onset   Thyroid disease Mother    Diabetes Mother    Diabetes Father     Social History   Socioeconomic History   Marital status: Single    Spouse name: Not on file   Number of children: Not on file   Years of education: Not on file   Highest education level: Not on file  Occupational History   Not on file  Tobacco Use   Smoking status: Never   Smokeless tobacco: Never  Vaping Use   Vaping Use: Never used  Substance and Sexual Activity   Alcohol use: Yes    Comment: occ   Drug use: No   Sexual activity: Yes    Partners: Male    Birth control/protection: None  Other Topics Concern   Not on file  Social History Narrative   Not on file   Social Determinants of Health   Financial Resource Strain: Not on file  Food Insecurity:  Not on file  Transportation Needs: Not on file  Physical Activity: Not on file  Stress: Not on file  Social Connections: Not on file  Intimate Partner Violence: Not on file    ROS  PHYSICAL EXAMINATION:    There were no vitals taken for this visit.    General appearance: alert, cooperative and appears stated age Neck: no adenopathy, supple, symmetrical, trachea midline and thyroid {CHL AMB PHY EX THYROID NORM [phone]::"normal to inspection and palpation"} Breasts: {Exam; breast:13139::"normal appearance, no masses or tenderness"} Abdomen: soft, non-tender; non distended, no masses,  no organomegaly  Pelvic: External genitalia:  no lesions              Urethra:  normal appearing urethra with no masses, tenderness or lesions              Bartholins and Skenes: normal  Vagina: normal appearing vagina with normal color and discharge, no lesions              Cervix: {CHL AMB PHY EX CERVIX NORM DEFAULT:[phone]::"no lesions"}              Bimanual Exam:  Uterus:  {CHL AMB PHY EX UTERUS NORM DEFAULT:[phone]::"normal size, contour, position, consistency, mobility, non-tender"}              Adnexa: {CHL AMB PHY EX ADNEXA NO MASS DEFAULT:[phone]::"no mass, fullness, tenderness"}              Rectovaginal: {yes no:314532}.  Confirms.              Anus:  normal sphincter tone, no lesions  Chaperone was present for exam.  ASSESSMENT     PLAN    An After Visit Summary was printed and given to the patient.  *** minutes face to face time of which over 50% was spent in counseling.

## 2022-11-18 ENCOUNTER — Telehealth: Payer: Self-pay | Admitting: *Deleted

## 2022-11-18 NOTE — Telephone Encounter (Signed)
Patient has an appointment 11/20/2022 colpo. Pt period started today (11/18/2022). Her period usually last 4 to 5 days. Pt would like to know if she should r/s appt. Routing to Provider for recommendations.

## 2022-11-19 NOTE — Telephone Encounter (Signed)
FYI. Pt notified and voiced understanding. Since pt's appt is at 330 in afternoon, pt advised to see how flow is doing after waking up and moving around the morning of. Pt voiced understanding and will call to r/s if needed after monitoring.

## 2022-11-19 NOTE — Telephone Encounter (Signed)
If her cycle is heavy, I would reschedule. If just spotting okay to keep the appointment.

## 2022-11-20 ENCOUNTER — Ambulatory Visit: Payer: Managed Care, Other (non HMO) | Admitting: Obstetrics and Gynecology

## 2022-11-20 NOTE — Progress Notes (Deleted)
GYNECOLOGY  VISIT   HPI: 32 y.o.   Single Black or African American Not Hispanic or Latino  female   G1P0010 with No LMP recorded.   here for  colposcopy.   GYNECOLOGIC HISTORY: No LMP recorded. Pap:  04/17/22 ASCUS, HR HPV positive Contraception: phexxi Menopausal hormone therapy: n/a        OB History     Gravida  1   Para      Term      Preterm      AB  1   Living         SAB      IAB      Ectopic      Multiple      Live Births                 Patient Active Problem List   Diagnosis Date Noted   Syncope 06/27/2019   Family history of metabolic and nutritional disorder 06/27/2019    Past Medical History:  Diagnosis Date   Obesity     Past Surgical History:  Procedure Laterality Date   CHOLECYSTECTOMY     DILATION AND CURETTAGE OF UTERUS     Abortion     Current Outpatient Medications  Medication Sig Dispense Refill   Lactic Ac-Citric Ac-Pot Bitart (PHEXXI) 1.8-1-0.4 % GEL Place 5 g vaginally daily. As needed 120 g 11   No current facility-administered medications for this visit.     ALLERGIES: Patient has no known allergies.  Family History  Problem Relation Age of Onset   Thyroid disease Mother    Diabetes Mother    Diabetes Father     Social History   Socioeconomic History   Marital status: Single    Spouse name: Not on file   Number of children: Not on file   Years of education: Not on file   Highest education level: Not on file  Occupational History   Not on file  Tobacco Use   Smoking status: Never   Smokeless tobacco: Never  Vaping Use   Vaping Use: Never used  Substance and Sexual Activity   Alcohol use: Yes    Comment: occ   Drug use: No   Sexual activity: Yes    Partners: Male    Birth control/protection: None  Other Topics Concern   Not on file  Social History Narrative   Not on file   Social Determinants of Health   Financial Resource Strain: Not on file  Food Insecurity: Not on file   Transportation Needs: Not on file  Physical Activity: Not on file  Stress: Not on file  Social Connections: Not on file  Intimate Partner Violence: Not on file    ROS  PHYSICAL EXAMINATION:    There were no vitals taken for this visit.    General appearance: alert, cooperative and appears stated age Neck: no adenopathy, supple, symmetrical, trachea midline and thyroid {CHL AMB PHY EX THYROID NORM DEFAULT:[phone]::"normal to inspection and palpation"} Breasts: {Exam; breast:13139::"normal appearance, no masses or tenderness"} Abdomen: soft, non-tender; non distended, no masses,  no organomegaly  Pelvic: External genitalia:  no lesions              Urethra:  normal appearing urethra with no masses, tenderness or lesions              Bartholins and Skenes: normal                 Vagina: normal appearing  vagina with normal color and discharge, no lesions              Cervix: {CHL AMB PHY EX CERVIX NORM [phone]::"no lesions"}              Bimanual Exam:  Uterus:  {CHL AMB PHY EX UTERUS NORM DEFAULT:[phone]::"normal size, contour, position, consistency, mobility, non-tender"}              Adnexa: {CHL AMB PHY EX ADNEXA NO MASS DEFAULT:[phone]::"no mass, fullness, tenderness"}              Rectovaginal: {yes no:314532}.  Confirms.              Anus:  normal sphincter tone, no lesions  Chaperone was present for exam.  ASSESSMENT     PLAN    An After Visit Summary was printed and given to the patient.  *** minutes face to face time of which over 50% was spent in counseling.

## 2022-11-23 ENCOUNTER — Telehealth: Payer: Self-pay | Admitting: *Deleted

## 2022-11-23 NOTE — Telephone Encounter (Signed)
04/17/22 PAP ASCUS, positive HR HPV 16  Patient has No show for colpo on 08/17/22, 08/31/22, 10/23/22, 11/20/22  Cancelled by patient on 05/15/22.   Dr. Talbert Nan -please advise.

## 2022-11-24 NOTE — Telephone Encounter (Signed)
Letter to [facility] to process discharge.  Routing to provider for final review.  Will close encounter.  Cc: Rosemarie Ax

## 2022-11-24 NOTE — Telephone Encounter (Signed)
Please send d/c letter.

## 2022-12-07 ENCOUNTER — Ambulatory Visit
Admission: EM | Admit: 2022-12-07 | Discharge: 2022-12-07 | Disposition: A | Discharge status: Home / Self Care | Payer: Managed Care, Other (non HMO) | Attending: Urgent Care | Admitting: Urgent Care

## 2022-12-07 DIAGNOSIS — N898 Other specified noninflammatory disorders of vagina: Secondary | ICD-10-CM | POA: Diagnosis not present

## 2022-12-07 DIAGNOSIS — R350 Frequency of micturition: Secondary | ICD-10-CM | POA: Insufficient documentation

## 2022-12-07 LAB — POCT URINALYSIS DIP (MANUAL ENTRY)
Bilirubin, UA: NEGATIVE
Glucose, UA: NEGATIVE mg/dL
Ketones, POC UA: NEGATIVE mg/dL
Leukocytes, UA: NEGATIVE
Nitrite, UA: NEGATIVE
Protein Ur, POC: NEGATIVE mg/dL
Spec Grav, UA: 1.015 (ref 1.010–1.025)
Urobilinogen, UA: 0.2 E.U./dL
pH, UA: 6.5 (ref 5.0–8.0)

## 2022-12-07 NOTE — ED Triage Notes (Signed)
Patient presents to UC for vaginal itching x 2 weeks, urinary freq x 1 week. Treating with AZO and monistat.

## 2022-12-07 NOTE — ED Provider Notes (Signed)
[facility]    CSN: [id number] Arrival date & time: 12/07/22  1721      History   Chief Complaint Chief Complaint  Patient presents with   Vaginal Itching   Urinary Frequency    HPI Carrie Hanson is a 32 y.o. female.    Vaginal Itching  Urinary Frequency    Patient presents urgent care for complaint of vaginal itching x 2 weeks and urinary frequency x 1 week.  Treating with OTC medication.  Past Medical History:  Diagnosis Date   Obesity     Patient Active Problem List   Diagnosis Date Noted   Syncope 06/27/2019   Family history of metabolic and nutritional disorder 06/27/2019    Past Surgical History:  Procedure Laterality Date   CHOLECYSTECTOMY     DILATION AND CURETTAGE OF UTERUS     Abortion     OB History     Gravida  1   Para      Term      Preterm      AB  1   Living         SAB      IAB      Ectopic      Multiple      Live Births               Home Medications    Prior to Admission medications   Medication Sig Start Date End Date Taking? Authorizing Provider  Lactic Ac-Citric Ac-Pot Bitart (PHEXXI) 1.8-1-0.4 % GEL Place 5 g vaginally daily. As needed 04/17/22   Chrzanowski, Annitta Needs, NP    Family History Family History  Problem Relation Age of Onset   Thyroid disease Mother    Diabetes Mother    Diabetes Father     Social History Social History   Tobacco Use   Smoking status: Never   Smokeless tobacco: Never  Vaping Use   Vaping Use: Never used  Substance Use Topics   Alcohol use: Yes    Comment: occ   Drug use: No     Allergies   Patient has no known allergies.   Review of Systems Review of Systems  Genitourinary:  Positive for frequency.     Physical Exam Triage Vital Signs ED Triage Vitals [12/07/22 1749]  Enc Vitals Group     BP      Pulse      Resp      Temp      Temp src      SpO2      Weight      Height      Head Circumference      Peak Flow      Pain Score 0      Pain Loc      Pain Edu?      Excl. in [place]?    No data found.  Updated Vital Signs LMP 11/17/2022   Visual Acuity Right Eye Distance:   Left Eye Distance:   Bilateral Distance:    Right Eye Near:   Left Eye Near:    Bilateral Near:     Physical Exam Vitals reviewed.  Constitutional:      Appearance: Normal appearance.  Skin:    General: Skin is warm and dry.  Neurological:     General: No focal deficit present.     Mental Status: She is alert and oriented to person, place, and time.  Psychiatric:  Mood and Affect: Mood normal.        Behavior: Behavior normal.      UC Treatments / Results  Labs (all labs ordered are listed, but only abnormal results are displayed) Labs Reviewed  POCT URINALYSIS DIP (MANUAL ENTRY) - Abnormal; Notable for the following components:      Result Value   Blood, UA trace-intact (*)    All other components within normal limits    EKG   Radiology No results found.  Procedures Procedures (including critical care time)  Medications Ordered in UC Medications - No data to display  Initial Impression / Assessment and Plan / UC Course  I have reviewed the triage vital signs and the nursing notes.  Pertinent labs & imaging results that were available during my care of the patient were reviewed by me and considered in my medical decision making (see chart for details).   UA does not suggest UTI.  No leukocytes.  No nitrites.  Some trace RBCs.  Results of vaginal swab will be communicated to patient when results available.   Final Clinical Impressions(s) / UC Diagnoses   Final diagnoses:  None   Discharge Instructions   None    ED Prescriptions   None    PDMP not reviewed this encounter.   Rose Phi, [place] 12/07/22 [phone]

## 2022-12-07 NOTE — Discharge Instructions (Addendum)
Follow up here or with your primary care provider if your symptoms are worsening or not improving.    

## 2022-12-08 LAB — CERVICOVAGINAL ANCILLARY ONLY
Bacterial Vaginitis (gardnerella): NEGATIVE
Candida Glabrata: NEGATIVE
Candida Vaginitis: NEGATIVE
Chlamydia: NEGATIVE
Comment: NEGATIVE
Comment: NEGATIVE
Comment: NEGATIVE
Comment: NEGATIVE
Comment: NEGATIVE
Comment: NORMAL
Neisseria Gonorrhea: NEGATIVE
Trichomonas: NEGATIVE

## 2023-12-06 ENCOUNTER — Ambulatory Visit: Payer: BC Managed Care – PPO | Admitting: Family Medicine

## 2023-12-06 ENCOUNTER — Encounter: Payer: Self-pay | Admitting: Family Medicine

## 2023-12-06 VITALS — BP 128/74 | HR 86 | Resp 16 | Ht 65.0 in | Wt 282.0 lb

## 2023-12-06 DIAGNOSIS — B977 Papillomavirus as the cause of diseases classified elsewhere: Secondary | ICD-10-CM | POA: Diagnosis not present

## 2023-12-06 DIAGNOSIS — Z833 Family history of diabetes mellitus: Secondary | ICD-10-CM | POA: Insufficient documentation

## 2023-12-06 DIAGNOSIS — E559 Vitamin D deficiency, unspecified: Secondary | ICD-10-CM

## 2023-12-06 DIAGNOSIS — Z6841 Body Mass Index (BMI) 40.0 and over, adult: Secondary | ICD-10-CM

## 2023-12-06 DIAGNOSIS — R87612 Low grade squamous intraepithelial lesion on cytologic smear of cervix (LGSIL): Secondary | ICD-10-CM | POA: Diagnosis not present

## 2023-12-06 DIAGNOSIS — Z8349 Family history of other endocrine, nutritional and metabolic diseases: Secondary | ICD-10-CM | POA: Insufficient documentation

## 2023-12-06 DIAGNOSIS — E66813 Obesity, class 3: Secondary | ICD-10-CM

## 2023-12-06 DIAGNOSIS — Z0000 Encounter for general adult medical examination without abnormal findings: Secondary | ICD-10-CM

## 2023-12-06 DIAGNOSIS — K219 Gastro-esophageal reflux disease without esophagitis: Secondary | ICD-10-CM

## 2023-12-06 DIAGNOSIS — Z87898 Personal history of other specified conditions: Secondary | ICD-10-CM | POA: Diagnosis not present

## 2023-12-06 MED ORDER — PANTOPRAZOLE SODIUM 40 MG PO TBEC: 40.0000 mg | DELAYED_RELEASE_TABLET | Freq: Every day | ORAL | 1 refills | Status: DC

## 2023-12-06 NOTE — Progress Notes (Signed)
 Name: Carrie Hanson   MRN: [id number]    DOB: [birth year]   Date:12/06/2023       Progress Note  Chief Complaint  Patient presents with   Establish Care     Subjective:   Carrie Hanson is a 33 y.o. female, presents to establish care and f/up on chronic conditions Prior PCP [place] 4+ years ago She otherwise has gone to GYN for [employer] health   PMHx of vit D deficiency, she just started a Vit D supplement OTC Vit D 3 No labs for a while (4-5 years)  Obesity - no recent changes, family hx of obesity and weight loss surgery Wt Readings from Last 5 Encounters:  12/06/23 282 lb (127.9 kg)  04/17/22 235 lb (106.6 kg)  07/31/21 210 lb (95.3 kg)  10/21/20 210 lb (95.3 kg)  07/26/20 194 lb (88 kg)   BMI Readings from Last 5 Encounters:  12/06/23 46.93 kg/m  04/17/22 38.51 kg/m  07/31/21 33.89 kg/m  10/21/20 33.89 kg/m  07/26/20 31.31 kg/m    Family hx of DM and thyroid disease - pt has no hx of high glucose of thyroid issues Father diseased - hx of renal failure on dialysis, and IDDM Mother with osteoporosis, DM obesity weight loss surgery One brother and one sister w/o any known medical issues  Pt with no hx of DM Lab Results  Component Value Date   HGBA1C 5.2 06/27/2019    She reports hx of falls due to clumsiness - 1 fall this past year w/o injury  Past syncope which has not happened in several years- no work up beyond ED visit and f/up OV  She had positive pap/HPV with colpo last year LGSIL with colpo, due for f/up pap this year Last pap/hpv: 2023. 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   History of syncope 12/06/2023   Family history of diabetes mellitus in father 12/06/2023   Family history of thyroid disease 12/06/2023   Family history of diabetes mellitus in mother 12/06/2023   Class 3 severe obesity with body mass index (BMI) of 45.0 to 49.9 in adult [facility]) 12/06/2023    Past Surgical History:  Procedure Laterality Date   CHOLECYSTECTOMY     DILATION AND CURETTAGE OF UTERUS     Abortion     Family History  Problem Relation Age of Onset   Thyroid disease Mother    Diabetes Mother    Osteoporosis Mother    Kidney disease Father        dialysis in 30-40's   Diabetes Father     Social History   Tobacco Use   Smoking status: Never   Smokeless tobacco: Never  Vaping Use   Vaping status: Never Used  Substance Use Topics   Alcohol use: Yes    Comment: occ   Drug use: No     No  Known Allergies  Health Maintenance  Topic Date Due   COVID-19 Vaccine (2 - 2024-25 season) 12/19/2023 (Originally 06/13/2023)   INFLUENZA VACCINE  01/10/2024 (Originally 05/13/2023)   Cervical Cancer Screening (HPV/Pap Cotest)  04/18/2027   DTaP/Tdap/Td (7 - Td or Tdap) 06/14/2029   Hepatitis C Screening  Completed   HIV Screening  Completed   HPV VACCINES  Aged Out    Chart Review Today: I personally reviewed active problem list, medication list, allergies, family history, social history, health maintenance, notes from last encounter, lab results, imaging with the patient/caregiver today.   Review of Systems  Constitutional: Negative.   HENT: Negative.    Eyes: Negative.   Respiratory: Negative.    Cardiovascular: Negative.   Gastrointestinal: Negative.   Endocrine: Negative.   Genitourinary: Negative.   Musculoskeletal: Negative.   Skin: Negative.    Allergic/Immunologic: Negative.   Neurological: Negative.   Hematological: Negative.   Psychiatric/Behavioral: Negative.    All other systems reviewed and are negative.        Objective:   Vitals:   12/06/23 0957  BP: 128/74  Pulse: 86  Resp: 16  SpO2: 100%  Weight: 282 lb (127.9 kg)  Height: 5\' 5"  (1.651 m)    Body mass index is 46.93 kg/m.  Physical Exam Vitals and nursing note reviewed.  Constitutional:      General: She is not in acute distress.    Appearance: Normal appearance. She is well-developed. She is obese. She is not ill-appearing, toxic-appearing or diaphoretic.  HENT:     Head: Normocephalic and atraumatic.     Right Ear: External ear normal.     Left Ear: External ear normal.     Nose: Nose normal.     Mouth/Throat:     Mouth: Mucous membranes are moist.     Pharynx: Oropharynx is clear.  Eyes:     General:        Right eye: No discharge.        Left eye: No discharge.     Conjunctiva/sclera: Conjunctivae normal.  Neck:     Trachea: No tracheal deviation.  Cardiovascular:     Rate and Rhythm: Normal rate and regular rhythm.     Pulses: Normal pulses.     Heart sounds: Normal heart sounds. No murmur heard.    No friction rub. No gallop.  Pulmonary:     Effort: Pulmonary effort is normal. No respiratory distress.     Breath sounds: Normal breath sounds. No stridor. No wheezing, rhonchi or rales.  Abdominal:     General: Bowel sounds are normal. There is no distension.     Palpations: Abdomen is soft.     Tenderness: There is no abdominal tenderness.  Skin:    General: Skin is warm and dry.     Capillary Refill: Capillary refill takes less than 2 seconds.     Findings: No rash.  Neurological:     Mental Status: She is alert. Mental status is at baseline.     Motor: No abnormal muscle tone.     Coordination: Coordination normal.  Psychiatric:        Mood and Affect: Mood normal.        Behavior: Behavior normal.      Functional  Status Survey: Is the patient deaf or have difficulty hearing?: No Does the patient have difficulty seeing, even when wearing glasses/contacts?: Yes (prescription glasses for near) Does the patient have difficulty concentrating, remembering, or making decisions?: No Does the  patient have difficulty walking or climbing stairs?: No (sometimes due to clumsiness, no other concerns) Does the patient have difficulty dressing or bathing?: No Does the patient have difficulty doing errands alone such as visiting a doctor's office or shopping?: No     06/26/2021    3:55 PM 07/31/2021    8:28 AM 12/25/2021    4:38 PM 06/22/2022   11:06 AM 12/06/2023   10:08 AM  Fall Risk  Falls in the past year?     1  Was there an injury with Fall?     0  Fall Risk Category Calculator     2  (RETIRED) Patient Fall Risk Level Low fall risk Low fall risk Low fall risk Low fall risk   Patient at Risk for Falls Due to     No Fall Risks     Results for orders placed or performed during the hospital encounter of 12/07/22  POCT urinalysis dipstick   Collection Time: 12/07/22  5:53 PM  Result Value Ref Range   Color, UA yellow yellow   Clarity, UA clear clear   Glucose, UA negative negative mg/dL   Bilirubin, UA negative negative   Ketones, POC UA negative negative mg/dL   Spec Grav, UA 1.610 9.604 - 1.025   Blood, UA trace-intact (A) negative   pH, UA 6.5 5.0 - 8.0   Protein Ur, POC negative negative mg/dL   Urobilinogen, UA 0.2 0.2 or 1.0 E.U./dL   Nitrite, UA Negative Negative   Leukocytes, UA Negative Negative  Cervicovaginal ancillary only   Collection Time: 12/07/22  6:14 PM  Result Value Ref Range   Neisseria Gonorrhea Negative    Chlamydia Negative    Trichomonas Negative    Bacterial Vaginitis (gardnerella) Negative    Candida Vaginitis Negative    Candida Glabrata Negative    Comment Normal Reference Range Candida Species - Negative    Comment Normal Reference Range Candida Galbrata - Negative     Comment Normal Reference Range Trichomonas - Negative    Comment Normal Reference Ranger Chlamydia - Negative    Comment      Normal Reference Range Neisseria Gonorrhea - Negative   Comment      Normal Reference Range Bacterial Vaginosis - Negative      Assessment & Plan:   Encounter for medical examination to establish care Personal and family hx thoroughly reviewed Chart reviewed back several years, hx updated   Vitamin D deficiency Assessment & Plan: Last vitamin D Lab Results  Component Value Date   VD25OH 11.24 (L) 07/26/2020  On Rx Vit D in the past but lost to f/up She just started OTC vit D 3 but doesn't know the dose She will check labs now - we will adjust supplement doses per results And do f/up with her CPE in ~3 months  Orders: -     VITAMIN D 25 Hydroxy (Vit-D Deficiency, Fractures) -     Comprehensive metabolic panel -     VITAMIN D 25 Hydroxy (Vit-D Deficiency, Fractures)  LGSIL on Pap smear of cervix Assessment & Plan: Colposcopy with [facility] last year Due for follow up PAP with next CPE  High risk HPV infection (see above)  History of syncope Assessment & Plan: No episodes for the past several years   Family history of diabetes mellitus in father -     Hemoglobin A1c  Family history of diabetes mellitus in mother -     Hemoglobin A1c  Adult general medical  exam - labs ordered now for future CPE - pt to complete about 1-2 weeks prior to CPE in May -     CBC with Differential/Platelet -     Hemoglobin A1c -     TSH -     Lipid panel -     VITAMIN D 25 Hydroxy (Vit-D Deficiency, Fractures) -     Comprehensive metabolic panel  Gastroesophageal reflux disease without esophagitis -  some intermittent upper abd discomfort, indigestion, also concerned bad breath may be related to GERD, trial PPI with diet/lifestyle efforts and f/up in the next 1-2 months if sx not improved or resolved    -  Pantoprazole Sodium; Take 1 tablet (40 mg total)  by mouth daily before breakfast.  Dispense: 30 tablet; Refill: 1  Class 3 severe obesity with body mass index (BMI) of 45.0 to 49.9 in adult, unspecified obesity type, unspecified whether serious comorbidity present (HCC)  Family hx of obesity with weight loss surgery With labs for CPE will screen for metabolic dysfunction/lab abnormalities Weight has gradually increased over the past 4 years  Wt Readings from Last 5 Encounters:  12/06/23 282 lb (127.9 kg)  04/17/22 235 lb (106.6 kg)  07/31/21 210 lb (95.3 kg)  10/21/20 210 lb (95.3 kg)  07/26/20 194 lb (88 kg)   BMI Readings from Last 5 Encounters:  12/06/23 46.93 kg/m  04/17/22 38.51 kg/m  07/31/21 33.89 kg/m  10/21/20 33.89 kg/m  07/26/20 31.31 kg/m     Return for CPE here after may 9th.   Danelle Berry, PA-C 12/06/23 1:06 PM

## 2023-12-06 NOTE — Patient Instructions (Signed)
 Try the protonix or pantoprazole for the next ~4 weeks to see if your stomach symptoms (and other symptoms) improve.  Please let me know if symptoms do not improve or if they return as soon as you stop the medications. See also below other diet and lifestyle aspects of GERD.  GERD in Adults: Diet Changes When you have gastroesophageal reflux disease (GERD), you may need to make changes to your diet. Choosing the right foods can help with your symptoms. Think about working with an expert in healthy eating called a dietitian. They can help you make healthy food choices. What are tips for following this plan? Reading food labels Look for foods that are low in saturated fat. Foods that may help with your symptoms include: Foods with less than 5% of daily value (DV) of fat. Foods with 0 grams of trans fat. Cooking [employer] in ways that don't use a lot of fat. These ways include: Baking. Steaming. Grilling. Broiling. To add flavor, try to use herbs that are low in spice and acidity. Avoid frying your food. Meal planning  Eat small meals often rather than eating 3 large meals each day. Eat your meals slowly in a place where you feel relaxed. If told by your health care provider, avoid: Foods that cause symptoms. Keep a food diary to keep track of foods that cause symptoms. Alcohol. Drinking a lot of liquid with meals. General instructions For 2-3 hours after you eat, avoid: Bending over. Exercise. Lying down. Chew sugar-free gum after meals. What foods should I eat? Eat a healthy diet. Try to include: Foods with high amounts of fiber. These include: Fruits and vegetables. Whole grains and beans. Low-fat dairy products. Lean meats, fish, and poultry. Egg whites. Foods that cause symptoms in someone else may not cause symptoms for you. Work with your provider to find foods that are safe for you. The items listed above may not be all the foods and drinks you can have. Talk with  a dietitian to learn more. The items listed above may not be a complete list of foods and beverages you can eat and drink. Contact a dietitian for more information. What foods should I avoid? Limiting some of these foods may help with your symptoms. Each person is different. Talk with a dietitian or your provider to help you find the exact foods to avoid. Some of the foods to avoid may include: Fruits Fruits with a lot of acid in them. These may include citrus fruits, such as oranges, grapefruit, pineapple, and lemons. Vegetables Deep-fried vegetables, such as [place] fries. Vegetables, sauces, or toppings made with added fat and vegetables with acid in them. These may include tomatoes and tomato products, chili peppers, onions, garlic, and horseradish. Grains Pastries or quick breads with added fat. Meats and other proteins High-fat meats, such as fatty beef or pork, hot dogs, ribs, ham, sausage, salami, and bacon. Fried meat or protein, such as fried fish and fried chicken. Egg yolks. Fats and oils Butter. Margarine. Shortening. Ghee. Drinks Coffee and other drinks with caffeine in them. Fizzy and sugary drinks, such as soda and energy drinks. Fruit juice made with acidic fruits, such as orange or grapefruit. Tomato juice. Sweets and desserts Chocolate and cocoa. Donuts. Seasonings and condiments Mint, such as peppermint and spearmint. Condiments, herbs, or seasonings that cause symptoms. These may include curry, hot sauce, or vinegar-based salad dressings. The items listed above may not be all the foods and drinks you should avoid. Talk with  a dietitian to learn more. Questions to ask your health care provider Changes to your diet and everyday life are often the first steps taken to manage symptoms of GERD. If these changes don't help, talk with your provider about taking medicines. Where to find more information [employer] Gastrointestinal [employer]: aboutgerd.org This  information is not intended to replace advice given to you by your health care provider. Make sure you discuss any questions you have with your health care provider. Document Revised: 08/10/2023 Document Reviewed: 02/24/2023 Elsevier Patient Education  2024 [employer].

## 2023-12-06 NOTE — Assessment & Plan Note (Signed)
 Colposcopy with [facility] last year Due for follow up PAP with next CPE

## 2023-12-06 NOTE — Assessment & Plan Note (Signed)
 No episodes for the past several years

## 2023-12-06 NOTE — Assessment & Plan Note (Signed)
 Last vitamin D Lab Results  Component Value Date   VD25OH 11.24 (L) 07/26/2020  On Rx Vit D in the past but lost to f/up She just started OTC vit D 3 but doesn't know the dose She will check labs now - we will adjust supplement doses per results And do f/up with her CPE in ~3 months

## 2024-01-31 ENCOUNTER — Other Ambulatory Visit: Payer: Self-pay | Admitting: Family Medicine

## 2024-01-31 DIAGNOSIS — K219 Gastro-esophageal reflux disease without esophagitis: Secondary | ICD-10-CM

## 2024-01-31 NOTE — Telephone Encounter (Signed)
 Requested Prescriptions  Pending Prescriptions Disp Refills   pantoprazole  (PROTONIX ) 40 MG tablet [Pharmacy Med Name: PANTOPRAZOLE  SOD DR 40 MG TAB] 30 tablet 1    Sig: TAKE 1 TABLET BY MOUTH DAILY BEFORE BREAKFAST     Gastroenterology: Proton Pump Inhibitors Failed - 01/31/2024  5:08 PM      Failed - Valid encounter within last 12 months    Recent Outpatient Visits           1 month ago Encounter for medical examination to establish care   [facility] Adeline Hone, [place]       Future Appointments             In 3 weeks Adeline Hone, PA-C [facility]

## 2024-02-22 ENCOUNTER — Encounter: Payer: BC Managed Care – PPO | Admitting: Family Medicine

## 2024-03-13 ENCOUNTER — Encounter: Admitting: Family Medicine

## 2024-03-13 DIAGNOSIS — Z0000 Encounter for general adult medical examination without abnormal findings: Secondary | ICD-10-CM

## 2024-03-28 DIAGNOSIS — R1013 Epigastric pain: Secondary | ICD-10-CM | POA: Diagnosis not present

## 2024-03-28 DIAGNOSIS — R1319 Other dysphagia: Secondary | ICD-10-CM | POA: Diagnosis not present

## 2024-03-28 DIAGNOSIS — R196 Halitosis: Secondary | ICD-10-CM | POA: Diagnosis not present

## 2024-03-28 DIAGNOSIS — K219 Gastro-esophageal reflux disease without esophagitis: Secondary | ICD-10-CM | POA: Diagnosis not present

## 2024-04-17 ENCOUNTER — Other Ambulatory Visit: Payer: Self-pay | Admitting: Family Medicine

## 2024-04-17 DIAGNOSIS — K219 Gastro-esophageal reflux disease without esophagitis: Secondary | ICD-10-CM

## 2024-04-18 NOTE — Telephone Encounter (Signed)
 Requ Requested Prescriptions  Pending Prescriptions Disp Refills   pantoprazole  (PROTONIX ) 40 MG tablet [Pharmacy Med Name: PANTOPRAZOLE  SOD DR 40 MG TAB] 30 tablet 1    Sig: TAKE 1 TABLET BY MOUTH DAILY BEFORE BREAKFAST     Gastroenterology: Proton Pump Inhibitors Passed - 04/18/2024  4:17 PM      Passed - Valid encounter within last 12 months    Recent Outpatient Visits           4 months ago Encounter for medical examination to establish care   [facility] Painter, Michelene, PA-C               ested by interface surescripts.

## 2024-08-29 ENCOUNTER — Ambulatory Visit: Admitting: Internal Medicine

## 2024-09-01 ENCOUNTER — Other Ambulatory Visit: Payer: Self-pay

## 2024-09-01 ENCOUNTER — Ambulatory Visit (INDEPENDENT_AMBULATORY_CARE_PROVIDER_SITE_OTHER): Admitting: Internal Medicine

## 2024-09-01 ENCOUNTER — Encounter: Payer: Self-pay | Admitting: Internal Medicine

## 2024-09-01 VITALS — BP 118/72 | HR 85 | Temp 98.2°F | Resp 16 | Ht 65.0 in | Wt 279.7 lb

## 2024-09-01 DIAGNOSIS — Z833 Family history of diabetes mellitus: Secondary | ICD-10-CM | POA: Diagnosis not present

## 2024-09-01 DIAGNOSIS — R42 Dizziness and giddiness: Secondary | ICD-10-CM | POA: Diagnosis not present

## 2024-09-01 DIAGNOSIS — Z13220 Encounter for screening for lipoid disorders: Secondary | ICD-10-CM

## 2024-09-01 DIAGNOSIS — E559 Vitamin D deficiency, unspecified: Secondary | ICD-10-CM

## 2024-09-01 DIAGNOSIS — R61 Generalized hyperhidrosis: Secondary | ICD-10-CM

## 2024-09-01 NOTE — Progress Notes (Signed)
 Acute Office Visit  Subjective:     Patient ID: Carrie Hanson, female    DOB: 1991/03/08, 33 y.o.   MRN: [id number]  Chief Complaint  Patient presents with   Excessive Sweating    For 2 months    HPI Patient is in today for excessive sweating. This is my first time meeting her.   Discussed the use of AI scribe software for clinical note transcription with the patient, who gave verbal consent to proceed.  History of Present Illness DIETRICH Hanson is a 33 year old female who presents with excessive sweating.  She has experienced excessive sweating for over a month, initially thought to be due to warm weather, but it persisted as the weather cooled. She wakes with a soaked nightgown and sweats during the day, mainly under her arms and in the groin, with a strong odor. The sweating is severe enough to require changing clothes. Various deodorants and baking soda have been ineffective, with the latter causing skin irritation. She uses over-the-counter triple antibiotic ointment for relief.  Her menstrual cycles are regular, though her last period started earlier than usual. She does not use hormonal medications or birth control. There is no recent increase in stress levels. She takes pantoprazole  as needed for digestive issues but has not used it recently.  Her family history includes a mother with unspecified thyroid  issues and both parents with diabetes. She has been monitored for diabetes, but her blood sugar levels and A1c have been normal. She recalls a past incident of fainting with low vitamin D  levels but has not experienced recent fainting episodes. She occasionally feels dizzy during activities involving frequent position changes.   Review of Systems  Constitutional:  Positive for diaphoresis. Negative for chills and fever.  Neurological:  Positive for dizziness. Negative for loss of consciousness.        Objective:    BP 118/72 (Cuff Size: Large)   Pulse 85    Temp 98.2 F (36.8 C) (Oral)   Resp 16   Ht 5' 5 (1.651 m)   Wt 279 lb 11.2 oz (126.9 kg)   LMP 08/20/2024   SpO2 98%   BMI 46.54 kg/m  BP Readings from Last 3 Encounters:  09/01/24 118/72  12/06/23 128/74  12/07/22 101/65   Wt Readings from Last 3 Encounters:  09/01/24 279 lb 11.2 oz (126.9 kg)  12/06/23 282 lb (127.9 kg)  04/17/22 235 lb (106.6 kg)      Physical Exam Constitutional:      Appearance: Normal appearance.  HENT:     Head: Normocephalic and atraumatic.  Eyes:     Conjunctiva/sclera: Conjunctivae normal.     Pupils: Pupils are equal, round, and reactive to light.  Cardiovascular:     Rate and Rhythm: Normal rate and regular rhythm.     Heart sounds: Normal heart sounds. No murmur heard.    No friction rub. No gallop.  Pulmonary:     Effort: Pulmonary effort is normal.     Breath sounds: Normal breath sounds.  Skin:    General: Skin is warm and dry.  Neurological:     General: No focal deficit present.     Mental Status: She is alert and oriented to person, place, and time. Mental status is at baseline.     No results found for any visits on 09/01/24.      Assessment & Plan:   Assessment & Plan  Generalized hyperhidrosis Excessive sweating with odor, unresponsive to  deodorants. Possible thyroid  dysfunction or hormonal imbalance, though regular cycles suggest low hormonal cause likelihood. - Ordered thyroid  function tests. - Advised avoiding irritants like baking soda, use OTC triple antibiotic for irritation.  Dizziness/Family Hx of DM Stable menstrual cycle and normal vital signs. Occasional dizziness likely due to orthostatic hypotension. - Ordered blood work: kidney, liver, electrolytes, CBC, thyroid  function, cholesterol, A1c, vitamin D . - Advised hydration and slow positional changes for orthostatic hypotension. - Scheduled follow-up for lab results next week.  Vitamin D  deficiency Previous deficiency, no current symptoms. - Ordered  vitamin D  level as part of blood work.  - CBC w/Diff/Platelet - Comprehensive Metabolic Panel (CMET) - TSH - HgB A1c - Lipid Profile - Vitamin D  (25 hydroxy)   Return in about 1 year (around 09/01/2025).  Sharyle Fischer, DO

## 2024-09-02 ENCOUNTER — Ambulatory Visit: Payer: Self-pay | Admitting: Internal Medicine

## 2024-09-02 DIAGNOSIS — E559 Vitamin D deficiency, unspecified: Secondary | ICD-10-CM

## 2024-09-02 LAB — COMPREHENSIVE METABOLIC PANEL WITH GFR
ALT: 12 IU/L (ref 0–32)
AST: 17 IU/L (ref 0–40)
Albumin: 4 g/dL (ref 3.9–4.9)
Alkaline Phosphatase: 85 IU/L (ref 41–116)
BUN/Creatinine Ratio: 6 — ABNORMAL LOW (ref 9–23)
BUN: 5 mg/dL — ABNORMAL LOW (ref 6–20)
Bilirubin Total: 0.6 mg/dL (ref 0.0–1.2)
CO2: 22 mmol/L (ref 20–29)
Calcium: 9.4 mg/dL (ref 8.7–10.2)
Chloride: 105 mmol/L (ref 96–106)
Creatinine, Ser: 0.89 mg/dL (ref 0.57–1.00)
Globulin, Total: 2.3 g/dL (ref 1.5–4.5)
Glucose: 85 mg/dL (ref 70–99)
Potassium: 4.5 mmol/L (ref 3.5–5.2)
Sodium: 139 mmol/L (ref 134–144)
Total Protein: 6.3 g/dL (ref 6.0–8.5)
eGFR: 88 mL/min/1.73 (ref >59)

## 2024-09-02 LAB — CBC WITH DIFFERENTIAL/PLATELET
Basophils Absolute: 0 x10E3/uL (ref 0.0–0.2)
Basos: 1 %
EOS (ABSOLUTE): 0.1 x10E3/uL (ref 0.0–0.4)
Eos: 4 %
Hematocrit: 38.5 % (ref 34.0–46.6)
Hemoglobin: 12.4 g/dL (ref 11.1–15.9)
Immature Grans (Abs): 0 x10E3/uL (ref 0.0–0.1)
Immature Granulocytes: 0 %
Lymphocytes Absolute: 1.6 x10E3/uL (ref 0.7–3.1)
Lymphs: 52 %
MCH: 31.9 pg (ref 26.6–33.0)
MCHC: 32.2 g/dL (ref 31.5–35.7)
MCV: 99 fL — ABNORMAL HIGH (ref 79–97)
Monocytes Absolute: 0.4 x10E3/uL (ref 0.1–0.9)
Monocytes: 13 %
Neutrophils Absolute: 0.9 x10E3/uL — ABNORMAL LOW (ref 1.4–7.0)
Neutrophils: 30 %
Platelets: 368 x10E3/uL (ref 150–450)
RBC: 3.89 x10E6/uL (ref 3.77–5.28)
RDW: 12.5 % (ref 11.7–15.4)
WBC: 3.1 x10E3/uL — ABNORMAL LOW (ref 3.4–10.8)

## 2024-09-02 LAB — TSH: TSH: 0.766 u[IU]/mL (ref 0.450–4.500)

## 2024-09-02 LAB — LIPID PANEL
Chol/HDL Ratio: 2.8 ratio (ref 0.0–4.4)
Cholesterol, Total: 188 mg/dL (ref 100–199)
HDL: 67 mg/dL (ref >39)
LDL Chol Calc (NIH): 108 mg/dL — ABNORMAL HIGH (ref 0–99)
Triglycerides: 71 mg/dL (ref 0–149)
VLDL Cholesterol Cal: 13 mg/dL (ref 5–40)

## 2024-09-02 LAB — HEMOGLOBIN A1C
Est. average glucose Bld gHb Est-mCnc: 94 mg/dL
Hgb A1c MFr Bld: 4.9 % (ref 4.8–5.6)

## 2024-09-02 LAB — VITAMIN D 25 HYDROXY (VIT D DEFICIENCY, FRACTURES): Vit D, 25-Hydroxy: 13.2 ng/mL — ABNORMAL LOW (ref 30.0–100.0)

## 2024-09-02 MED ORDER — VITAMIN D (ERGOCALCIFEROL) 1.25 MG (50000 UNIT) PO CAPS: 50000.0000 [IU] | ORAL_CAPSULE | ORAL | 0 refills | Status: AC

## 2024-09-25 ENCOUNTER — Other Ambulatory Visit: Payer: Self-pay | Admitting: Internal Medicine

## 2024-09-25 DIAGNOSIS — R61 Generalized hyperhidrosis: Secondary | ICD-10-CM

## 2024-09-25 DIAGNOSIS — D709 Neutropenia, unspecified: Secondary | ICD-10-CM

## 2024-09-25 MED ORDER — DRYSOL 20 % EX SOLN: Freq: Every day | CUTANEOUS | 0 refills | Status: DC

## 2024-10-03 ENCOUNTER — Inpatient Hospital Stay

## 2024-10-03 ENCOUNTER — Inpatient Hospital Stay: Attending: Oncology | Admitting: Oncology

## 2024-10-16 ENCOUNTER — Other Ambulatory Visit: Payer: Self-pay | Admitting: Internal Medicine

## 2024-10-16 DIAGNOSIS — R61 Generalized hyperhidrosis: Secondary | ICD-10-CM

## 2024-10-16 MED ORDER — DRYSOL 20 % EX SOLN: Freq: Every day | CUTANEOUS | 5 refills | Status: AC

## 2024-10-24 ENCOUNTER — Inpatient Hospital Stay: Attending: Oncology | Admitting: Oncology

## 2024-10-24 ENCOUNTER — Encounter: Payer: Self-pay | Admitting: Oncology

## 2024-10-24 ENCOUNTER — Inpatient Hospital Stay

## 2024-10-24 VITALS — BP 113/46 | HR 66 | Temp 97.1°F | Ht 65.0 in | Wt 280.0 lb

## 2024-10-24 DIAGNOSIS — D709 Neutropenia, unspecified: Secondary | ICD-10-CM

## 2024-10-24 LAB — CBC WITH DIFFERENTIAL/PLATELET
Abs Immature Granulocytes: 0 K/uL (ref 0.00–0.07)
Band Neutrophils: 0 %
Basophils Absolute: 0 K/uL (ref 0.0–0.1)
Basophils Relative: 1 %
Blasts: 0 %
Eosinophils Absolute: 0.1 K/uL (ref 0.0–0.5)
Eosinophils Relative: 2 %
HCT: 37.1 % (ref 36.0–46.0)
Hemoglobin: 11.9 g/dL — ABNORMAL LOW (ref 12.0–15.0)
Immature Granulocytes: 0 %
Lymphocytes Relative: 43 %
Lymphs Abs: 1.8 K/uL (ref 0.7–4.0)
MCH: 31.5 pg (ref 26.0–34.0)
MCHC: 32.1 g/dL (ref 30.0–36.0)
MCV: 98.1 fL (ref 80.0–100.0)
Metamyelocytes Relative: 0 %
Monocytes Absolute: 0.4 K/uL (ref 0.1–1.0)
Monocytes Relative: 9 %
Myelocytes: 0 %
Neutro Abs: 1.8 K/uL (ref 1.7–7.7)
Neutrophils Relative %: 45 %
Other: 0 %
Platelets: 363 K/uL (ref 150–400)
Promyelocytes Relative: 0 %
RBC: 3.78 MIL/uL — ABNORMAL LOW (ref 3.87–5.11)
RDW: 13.6 % (ref 11.5–15.5)
WBC: 4.1 K/uL (ref 4.0–10.5)
nRBC: 0 % (ref 0.0–0.2)
nRBC: 0 /100{WBCs}

## 2024-10-24 LAB — HEPATITIS PANEL, ACUTE
HCV Ab: NONREACTIVE
Hep A IgM: NONREACTIVE
Hep B C IgM: NONREACTIVE
Hepatitis B Surface Ag: NONREACTIVE

## 2024-10-24 LAB — TECHNOLOGIST SMEAR REVIEW: Plt Morphology: ADEQUATE

## 2024-10-24 LAB — LACTATE DEHYDROGENASE: LDH: 174 U/L (ref 105–235)

## 2024-10-24 LAB — FOLATE: Folate: 10.4 ng/mL

## 2024-10-24 LAB — HIV ANTIBODY (ROUTINE TESTING W REFLEX): HIV Screen 4th Generation wRfx: NONREACTIVE

## 2024-10-24 LAB — VITAMIN B12: Vitamin B-12: 428 pg/mL (ref 180–914)

## 2024-10-24 NOTE — Progress Notes (Signed)
 " Hematology/Oncology Consult note [phone] [phone]        REFERRING PROVIDER: Bernardo Fend, DO   CHIEF COMPLAINTS/REASON FOR VISIT:  Evaluation of neutropenia   ASSESSMENT & PLAN:   Neutropenia I discussed with patient that the differential diagnosis of leukopenia is broad, including acute or chronic infection, inflammation, nutrition deficiency, autoimmune disease,  ethnic, or malignant etiology including underlying bone morrow disorders.  For the work up of patient's leukoepenia, I recommend checking CBC;CMP, LDH; smear review, folate, copper , ANA, Vitamin B12, hepatitis, HIV, flowcytometry and monoclonal gammopathy workup.     Orders Placed This Encounter  Procedures   Vitamin B12    Standing Status:   Future    Number of Occurrences:   1    Expected Date:   10/24/2024    Expiration Date:   10/24/2025   Folate    Standing Status:   Future    Number of Occurrences:   1    Expected Date:   10/24/2024    Expiration Date:   10/24/2025   CBC with Differential/Platelet    Standing Status:   Future    Number of Occurrences:   1    Expected Date:   10/24/2024    Expiration Date:   10/24/2025   Multiple Myeloma Panel (SPEP&IFE w/QIG)    Standing Status:   Future    Number of Occurrences:   1    Expected Date:   10/24/2024    Expiration Date:   10/24/2025   Kappa/lambda light chains    Standing Status:   Future    Number of Occurrences:   1    Expected Date:   10/24/2024    Expiration Date:   10/24/2025   Flow cytometry panel-leukemia/lymphoma work-up    Standing Status:   Future    Number of Occurrences:   1    Expected Date:   10/24/2024    Expiration Date:   10/24/2025   Lactate dehydrogenase    Standing Status:   Future    Number of Occurrences:   1    Expected Date:   10/24/2024    Expiration Date:   10/24/2025   ANA, IFA (with reflex)    Standing Status:   Future    Number of Occurrences:   1    Expected Date:   10/24/2024    Expiration  Date:   10/24/2025   Copper , serum    Standing Status:   Future    Number of Occurrences:   1    Expected Date:   10/24/2024    Expiration Date:   10/24/2025   HIV Antibody (routine testing w rflx)    Standing Status:   Future    Number of Occurrences:   1    Expected Date:   10/24/2024    Expiration Date:   10/24/2025   Hepatitis panel, acute    Standing Status:   Future    Number of Occurrences:   1    Expected Date:   10/24/2024    Expiration Date:   10/24/2025   Technologist smear review    Standing Status:   Future    Number of Occurrences:   1    Expected Date:   10/24/2024    Expiration Date:   10/24/2025    Clinical information::   neutropenia.   Follow up in a few weeks to review results.  All questions were answered. The patient knows to call the clinic with any problems,  questions or concerns.  Zelphia Cap, MD, PhD [facility] Hematology Oncology 10/24/2024   HISTORY OF PRESENTING ILLNESS:   Carrie Hanson is a  34 y.o.  female with PMH listed below was seen in consultation at the request of  Bernardo Fend, DO  for evaluation of neutropenia  Discussed the use of AI scribe software for clinical note transcription with the patient, who gave verbal consent to proceed.   Laboratory evaluations over the past several years have demonstrated neutrophil counts of 0.9 and total white blood cell counts ranging from 3.1 to 3.4. She does not undergo frequent blood work, with intervals of several years between tests, and her cytopenias have remained stable over time.  She denies frequent or severe bacterial infections, including pneumonia, sinusitis, or urinary tract infections in recent years. She describes a prior history of recurrent urinary tract infections and candidiasis associated with periods of increased sexual activity, which have resolved since becoming sexually inactive. She expresses concern regarding infection risk but is uncertain of the etiology. 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  She denies unintentional weight loss, maintains good appetite, and has no chronic arthralgias, dental pain, sinus symptoms, rash, or foreign devices. She does not smoke and has abstained from alcohol in the current year, with prior consumption limited to occasional wine. She takes Cat's Claw herbal supplement for gastrointestinal symptoms, specifically a sensation of internal odor, and has previously consulted dentistry and gastroenterology for these symptoms, including gastritis.  MEDICAL HISTORY:  Past Medical History:  Diagnosis Date   Obesity     SURGICAL HISTORY: Past Surgical History:  Procedure Laterality Date   CHOLECYSTECTOMY     DILATION AND CURETTAGE OF UTERUS     Abortion     SOCIAL HISTORY: Social History   Socioeconomic History   Marital status: Single    Spouse name: Not on file   Number of children: Not on file   Years of education: Not on file   Highest education level: Not on file  Occupational History   Not on file  Tobacco Use   Smoking status: Never   Smokeless tobacco: Never  Vaping Use   Vaping status: Never Used  Substance and Sexual Activity   Alcohol use: Yes    Comment: occ   Drug use: No   Sexual activity: Yes    Partners: Male    Birth control/protection: None, Condom  Other Topics Concern   Not on file  Social History Narrative   Working - [employer], lives alone    Social Drivers of Health   Tobacco Use: Low Risk (10/24/2024)   Patient History    Smoking Tobacco Use: Never    Smokeless Tobacco Use: Never    Passive Exposure: Not on file  Financial Resource Strain: Medium Risk (03/28/2024)   Received from [facility]   Overall Financial Resource Strain (CARDIA)    Difficulty of Paying Living Expenses: Somewhat hard  Food Insecurity: No Food Insecurity (10/24/2024)   Epic    Worried About Programme Researcher, Broadcasting/film/video in the Last Year: Never true     Ran Out of Food in the Last Year: Never true  Recent Concern: Food Insecurity - Food Insecurity Present (10/24/2024)   Epic    Worried About Programme Researcher, Broadcasting/film/video in the Last Year: Sometimes true    Ran Out of Food in the Last Year: Never true  Transportation Needs: No Transportation Needs (10/24/2024)  Epic    Lack of Transportation (Medical): No    Lack of Transportation (Non-Medical): No  Physical Activity: Sufficiently Active (12/06/2023)   Exercise Vital Sign    Days of Exercise per Week: 4 days    Minutes of Exercise per Session: 40 min  Stress: No Stress Concern Present (12/06/2023)   [employer] of Occupational Health - Occupational Stress Questionnaire    Feeling of Stress : Not at all  Social Connections: Moderately Isolated (12/06/2023)   Social Connection and Isolation Panel    Frequency of Communication with Friends and Family: More than three times a week    Frequency of Social Gatherings with Friends and Family: Once a week    Attends Religious Services: More than 4 times per year    Active Member of [employer] or Organizations: No    Attends Banker Meetings: Never    Marital Status: Never married  Intimate Partner Violence: Not At Risk (10/24/2024)   Epic    Fear of Current or Ex-Partner: No    Emotionally Abused: No    Physically Abused: No    Sexually Abused: No  Depression (PHQ2-9): Low Risk (10/24/2024)   Depression (PHQ2-9)    PHQ-2 Score: 0  Alcohol Screen: Not on file  Housing: Low Risk (10/24/2024)   Epic    Unable to Pay for Housing in the Last Year: No    Number of Times Moved in the Last Year: 0    Homeless in the Last Year: No  Utilities: Not At Risk (10/24/2024)   Epic    Threatened with loss of utilities: No  Health Literacy: Adequate Health Literacy (12/06/2023)   B1300 Health Literacy    Frequency of need for help with medical instructions: Never    FAMILY HISTORY: Family History  Problem Relation Age of Onset   Thyroid  disease  Mother    Diabetes Mother    Osteoporosis Mother    Kidney disease Father        dialysis in 30-40's   Diabetes Father     ALLERGIES:  has no known allergies.  MEDICATIONS:  Current Outpatient Medications  Medication Sig Dispense Refill   aluminum chloride (DRYSOL) 20 % external solution Apply topically at bedtime. 60 mL 5   Vitamin D , Ergocalciferol , (DRISDOL ) 1.25 MG (50000 UNIT) CAPS capsule Take 1 capsule (50,000 Units total) by mouth every 7 (seven) days. 12 capsule 0   No current facility-administered medications for this visit.    Review of Systems  Constitutional:  Negative for appetite change, chills, fatigue and fever.  HENT:   Negative for hearing loss and voice change.   Eyes:  Negative for eye problems.  Respiratory:  Negative for chest tightness and cough.   Cardiovascular:  Negative for chest pain.  Gastrointestinal:  Negative for abdominal distention, abdominal pain and blood in stool.  Endocrine: Negative for hot flashes.  Genitourinary:  Negative for difficulty urinating and frequency.   Musculoskeletal:  Negative for arthralgias.  Skin:  Negative for itching and rash.  Neurological:  Negative for extremity weakness.  Hematological:  Negative for adenopathy.  Psychiatric/Behavioral:  Negative for confusion.    PHYSICAL EXAMINATION:  Vitals:   10/24/24 0927  BP: (!) 113/46  Pulse: 66  Temp: (!) 97.1 F (36.2 C)  SpO2: 100%   Filed Weights   10/24/24 0927  Weight: 280 lb (127 kg)    Physical Exam Constitutional:      General: She is not in acute distress. HENT:  Head: Normocephalic and atraumatic.  Eyes:     General: No scleral icterus. Cardiovascular:     Rate and Rhythm: Normal rate and regular rhythm.  Pulmonary:     Effort: Pulmonary effort is normal. No respiratory distress.     Breath sounds: Normal breath sounds. No wheezing.  Abdominal:     General: Bowel sounds are normal. There is no distension.     Palpations: Abdomen is  soft.  Musculoskeletal:        General: No deformity. Normal range of motion.     Cervical back: Normal range of motion and neck supple.  Skin:    General: Skin is warm and dry.     Findings: No erythema or rash.  Neurological:     Mental Status: She is alert and oriented to person, place, and time. Mental status is at baseline.  Psychiatric:        Mood and Affect: Mood normal.     LABORATORY DATA:  I have reviewed the data as listed    Latest Ref Rng & Units 10/24/2024   10:23 AM 09/01/2024    9:26 AM 07/31/2021    9:01 AM  CBC  WBC 4.0 - 10.5 K/uL 4.1  3.1  4.7   Hemoglobin 12.0 - 15.0 g/dL 88.0  87.5  87.4   Hematocrit 36.0 - 46.0 % 37.1  38.5  39.0   Platelets 150 - 400 K/uL 363  368  341       Latest Ref Rng & Units 09/01/2024    9:26 AM 07/31/2021    9:01 AM 10/21/2020   10:38 AM  CMP  Glucose 70 - 99 mg/dL 85  78  88   BUN 6 - 20 mg/dL 5  14  9    Creatinine 0.57 - 1.00 mg/dL 9.10  9.19  9.21   Sodium 134 - 144 mmol/L 139  140  139   Potassium 3.5 - 5.2 mmol/L 4.5  3.6  4.6   Chloride 96 - 106 mmol/L 105  105  104   CO2 20 - 29 mmol/L 22  28  24    Calcium 8.7 - 10.2 mg/dL 9.4  8.8  9.3   Total Protein 6.0 - 8.5 g/dL 6.3  7.4  7.1   Total Bilirubin 0.0 - 1.2 mg/dL 0.6  1.0  2.0   Alkaline Phos 41 - 116 IU/L 85  55  43   AST 0 - 40 IU/L 17  16  19    ALT 0 - 32 IU/L 12  11  9        RADIOGRAPHIC STUDIES: I have personally reviewed the radiological images as listed and agreed with the findings in the report. No results found.       "

## 2024-10-24 NOTE — Assessment & Plan Note (Signed)
 I discussed with patient that the differential diagnosis of leukopenia is broad, including acute or chronic infection, inflammation, nutrition deficiency, autoimmune disease,  ethnic, or malignant etiology including underlying bone morrow disorders.  For the work up of patient's leukoepenia, I recommend checking CBC;CMP, LDH; smear review, folate, copper , ANA, Vitamin B12, hepatitis, HIV, flowcytometry and monoclonal gammopathy workup.

## 2024-10-25 LAB — KAPPA/LAMBDA LIGHT CHAINS
Kappa free light chain: 21 mg/L — ABNORMAL HIGH (ref 3.3–19.4)
Kappa, lambda light chain ratio: 1.25 (ref 0.26–1.65)
Lambda free light chains: 16.8 mg/L (ref 5.7–26.3)

## 2024-10-25 LAB — COPPER, SERUM: Copper: 89 ug/dL (ref 76–142)

## 2024-10-25 LAB — ANTINUCLEAR ANTIBODIES, IFA: ANA Ab, IFA: NEGATIVE

## 2024-10-26 LAB — MULTIPLE MYELOMA PANEL, SERUM
Albumin SerPl Elph-Mcnc: 3.6 g/dL (ref 2.9–4.4)
Albumin/Glob SerPl: 1.3 (ref 0.7–1.7)
Alpha 1: 0.2 g/dL (ref 0.0–0.4)
Alpha2 Glob SerPl Elph-Mcnc: 0.7 g/dL (ref 0.4–1.0)
B-Globulin SerPl Elph-Mcnc: 1 g/dL (ref 0.7–1.3)
Gamma Glob SerPl Elph-Mcnc: 1.1 g/dL (ref 0.4–1.8)
Globulin, Total: 3 g/dL (ref 2.2–3.9)
IgA: 145 mg/dL (ref 87–352)
IgG (Immunoglobin G), Serum: 1185 mg/dL (ref 586–1602)
IgM (Immunoglobulin M), Srm: 99 mg/dL (ref 26–217)
Total Protein ELP: 6.6 g/dL (ref 6.0–8.5)

## 2024-10-26 LAB — COMP PANEL: LEUKEMIA/LYMPHOMA

## 2024-11-12 ENCOUNTER — Telehealth: Payer: Self-pay | Admitting: Oncology

## 2024-11-12 NOTE — Telephone Encounter (Signed)
 Due to weather and MD schedule, I called pt to see if she wanted to do virtual visit on 2/2 instead of coming into the clinic. Scheduling phone number provided

## 2024-11-15 ENCOUNTER — Encounter: Payer: Self-pay | Admitting: Oncology

## 2024-11-15 ENCOUNTER — Inpatient Hospital Stay: Attending: Oncology | Admitting: Oncology

## 2025-09-03 ENCOUNTER — Ambulatory Visit: Admitting: Internal Medicine
